# Patient Record
Sex: Male | Born: 1955 | Race: White | Hispanic: Yes | Marital: Married
Health system: Midwestern US, Community
[De-identification: ages and names within clinical notes are randomized; demographics above are authoritative.]

## PROBLEM LIST (undated history)

## (undated) DIAGNOSIS — I10 Essential (primary) hypertension: Secondary | ICD-10-CM

## (undated) DIAGNOSIS — D649 Anemia, unspecified: Secondary | ICD-10-CM

## (undated) DIAGNOSIS — G4733 Obstructive sleep apnea (adult) (pediatric): Secondary | ICD-10-CM

## (undated) DIAGNOSIS — Z0000 Encounter for general adult medical examination without abnormal findings: Secondary | ICD-10-CM

## (undated) DIAGNOSIS — E782 Mixed hyperlipidemia: Secondary | ICD-10-CM

## (undated) DIAGNOSIS — R109 Unspecified abdominal pain: Secondary | ICD-10-CM

## (undated) DIAGNOSIS — R739 Hyperglycemia, unspecified: Secondary | ICD-10-CM

---

## 2017-08-30 ENCOUNTER — Encounter: Attending: Family Medicine

## 2017-09-19 ENCOUNTER — Ambulatory Visit: Admit: 2017-09-19 | Discharge: 2017-09-19 | Payer: PRIVATE HEALTH INSURANCE | Attending: Family Medicine

## 2017-09-19 MED ORDER — ZOSTER VAC RECOMB ADJUVANTED 50 MCG/0.5ML IM SUSR
50 MCG/0.5ML | INTRAMUSCULAR | 1 refills | Status: DC
Start: 2017-09-19 — End: 2018-09-25

## 2017-09-19 MED ORDER — NAPROXEN 500 MG PO TABS
500 MG | ORAL_TABLET | Freq: Two times a day (BID) | ORAL | 0 refills | Status: DC
Start: 2017-09-19 — End: 2018-05-31

## 2017-09-19 NOTE — Progress Notes (Signed)
2 identifiers used: name and dob.  Hands washed before taking vitals.      APPOINTMENT INFO:   New Patient, new to area from [place].  OTHER CONCERNS:  1.) BP seems to be elevated. Self monitoring  2.) Left knee, sharp pain near the knee cap. Bending and applying pressure causes the pain. Onset 1 month.  3.) Achilles heel pain, when first standing after sitting for awhile. Onset: 6 months.  HIGH BP READING  ---------------------------------------------------------------------  Depression screening, GOALS, [facility] and SDOH flowsheets completed.  Health Maintenance:   - Last Colonoscopy: unknown date, 4 yrs   - Last Shingles Vacc: 2-3 yrs   - Last DTaP/Tdap/Td Vacc: over due     Patient Self-Management Goal for Health Maintenance  Weight loss (20 lbs) and eating healthy.  Barriers: The love of food  Plan for overcoming my barriers: better self control  Confidence: 7/10  Anticipated Goal Completion Date: 09/20/2018  -------------------------------------------------------------------    Pt is ok with the medical student being present in the visit.  ----------------------------------------------------------------------  Pt notified after the physician is complete with the visit, the MA will return to go over orders, any questions and check-out.    Room sanitized after visit.

## 2017-09-19 NOTE — Progress Notes (Addendum)
09/19/2017    Jeffrey Cisneros (DOB:  [birth year]) is a 62 y.o. male, here for a preventive medicine evaluation.    Tries to keep to a healthy diet - tries to avoid cheese and salt.   Exercise - hopes to restart next week at the gym. - [employer].   Last colonoscopy- 2014, [facility] in 10 year [facility]  Last Shingrix - not yet. Had zostavax  Tdap - 08/2008.       Complaints today    Left knee pain -x 2 weeks. Only when kneeling. No fall or trauma to the area. No medications taken. No other swelling, no locking, no instability.     Achilles tendon pain x 6 months. Ok when up and walking around. When stationary for a while and then standing up, hurts.   Has been doing stretches, no medications taken.     There is no problem list on file for this patient.      Review of Systems   Constitutional: Negative for chills, fatigue and fever.   HENT: Negative for congestion, ear pain and sore throat.    Eyes: Negative for discharge and redness.   Respiratory: Negative for cough, shortness of breath and wheezing.    Cardiovascular: Negative for chest pain, palpitations and leg swelling.   Gastrointestinal: Negative for abdominal pain, constipation, diarrhea and vomiting.   Genitourinary: Negative for dysuria and frequency.   Musculoskeletal: Negative for arthralgias.   Skin: Negative for rash.   Neurological: Negative for dizziness and headaches.   Hematological: Does not bruise/bleed easily.   Psychiatric/Behavioral: Negative for behavioral problems.       Prior to Visit Medications    Medication Sig Taking? Authorizing Provider   Multiple Vitamin (MULTI-VITAMIN DAILY PO) Take by mouth Yes Historical Provider, MD   Cholecalciferol (VITAMIN D3) 2000 units CAPS Take by mouth daily Yes Historical Provider, MD   cetirizine (ZYRTEC) 10 MG tablet Take 10 mg by mouth daily Yes Historical Provider, MD   zoster recombinant adjuvanted vaccine (SHINGRIX) 50 MCG/0.5ML SUSR injection Inject 0.5 mLs into the muscle See Admin Instructions 1  dose now and repeat in 2-6 months Yes Kanika Bungert Manya SilvasBhatt Abraham, MD   naproxen (NAPROSYN) 500 MG tablet Take 1 tablet by mouth 2 times daily (with meals) for 7 days Yes Caydon Feasel Manya SilvasBhatt Abraham, MD        Allergies   Allergen Reactions   ??? Dilaudid [Hydromorphone Hcl] Anaphylaxis       Past Medical History:   Diagnosis Date   ??? Hypertension    ??? Osteopenia    ??? Prostate cancer [facility](HCC)        Past Surgical History:   Procedure Laterality Date   ??? PROSTATE SURGERY     ??? SKIN BIOPSY         Social History     Socioeconomic History   ??? Marital status: Married     Spouse name: Not on file   ??? Number of children: Not on file   ??? Years of education: Not on file   ??? Highest education level: Not on file   Occupational History   ??? Not on file   Social Needs   ??? Financial resource strain: Not on file   ??? Food insecurity:     Worry: Not on file     Inability: Not on file   ??? Transportation needs:     Medical: Not on file     Non-medical: Not on file   Tobacco  Use   ??? Smoking status: Never Smoker   ??? Smokeless tobacco: Never Used   Substance and Sexual Activity   ??? Alcohol use: Not on file     Comment: social   ??? Drug use: Never   ??? Sexual activity: Yes     Partners: Female   Lifestyle   ??? Physical activity:     Days per week: Not on file     Minutes per session: Not on file   ??? Stress: Not on file   Relationships   ??? Social connections:     Talks on phone: Not on file     Gets together: Not on file     Attends religious service: Not on file     Active member of club or organization: Not on file     Attends meetings of clubs or organizations: Not on file     Relationship status: Not on file   ??? Intimate partner violence:     Fear of current or ex partner: Not on file     Emotionally abused: Not on file     Physically abused: Not on file     Forced sexual activity: Not on file   Other Topics Concern   ??? Not on file   Social History Narrative   ??? Not on file        Family History   Problem Relation Age of Onset   ??? Breast Cancer Mother    ???  Other Mother         Dementia   ??? Other Father         Joaquin Courts   ??? Heart Disease Father        ADVANCE DIRECTIVE: N, Not Received    Vitals:    09/19/17 0933 09/19/17 1114   BP: (!) 144/86 (!) 142/84   Site: Right Upper Arm    Position: Sitting    Cuff Size: Large Adult    Pulse: 70    Temp: 98.4 ??F (36.9 ??C)    TempSrc: Temporal    SpO2: 96%    Weight: 195 lb 14.4 oz (88.9 kg)    Height: 5\' 9"  (1.753 m)        Estimated body mass index is 28.93 kg/m?? as calculated from the following:    Height as of this encounter: 5\' 9"  (1.753 m).    Weight as of this encounter: 195 lb 14.4 oz (88.9 kg).    Physical Exam   Constitutional: He is oriented to person, place, and time. He appears well-developed and well-nourished. No distress.   HENT:   Head: Normocephalic.   Right Ear: External ear normal.   Left Ear: External ear normal.   Nose: Nose normal.   Mouth/Throat: Oropharynx is clear and moist.   Eyes: Pupils are equal, round, and reactive to light. Conjunctivae are normal.   Neck: Normal range of motion. Neck supple. No thyromegaly present.   Cardiovascular: Normal rate, regular rhythm and normal heart sounds. Exam reveals no gallop and no friction rub.   No murmur heard.  Pulmonary/Chest: Effort normal and breath sounds normal. No stridor. No respiratory distress. He has no wheezes. He has no rales.   Abdominal: Soft. Bowel sounds are normal. He exhibits no mass. There is no tenderness. There is no rebound and no guarding.   Musculoskeletal: Normal range of motion. He exhibits no edema.   Left knee - tender with deep palpation over the anterolateral aspect of the knee,  mild swelling. Normal ROM of the knee, no instability felt.   Lower legs - no tenderness over the achilles tendons. Normal ROM at the ankles. No swelling.    Lymphadenopathy:     He has no cervical adenopathy.   Neurological: He is alert and oriented to person, place, and time. No cranial nerve deficit. He exhibits normal muscle tone.   Skin: No rash  noted.   Psychiatric: He has a normal mood and affect. His behavior is normal.   Nursing note and vitals reviewed.      No flowsheet data found.    No results found for: CHOL, CHOLFAST, TRIG, TRIGLYCFAST, HDL, LDLCHOLESTEROL, LDLCALC, GLUF, GLUCOSE, LABA1C    The ASCVD Risk score Denman George DC Jr., et al., 2013) failed to calculate for the following reasons:    Cannot find a previous HDL lab    Cannot find a previous total cholesterol lab    Immunization History   Administered Date(s) Administered   ??? Tdap (Boostrix, Adacel) 09/15/2008   ??? Zoster Live (Zostavax) 09/06/2013       Health Maintenance   Topic Date Due   ??? Hepatitis C screen  September 02, 1955   ??? HIV screen  08/04/1968   ??? Lipid screen  08/04/1993   ??? Diabetes screen  08/04/1993   ??? Colon cancer screen colonoscopy  08/05/2003   ??? Shingles Vaccine (2 of 3) 11/01/2013   ??? Flu vaccine (1) 10/21/2017   ??? DTaP/Tdap/Td vaccine (2 - Td) 09/16/2018   ??? Pneumococcal 0-64 years Vaccine  Aged Out       ASSESSMENT/PLAN:  1. Annual physical exam  Check general blood tests.   Recommended shingrix   Up to date with colonoscopy - records requested.   - Comprehensive Metabolic Panel; Future  - Hemoglobin A1C; Future  - Lipid Panel; Future  - CBC Auto Differential; Future  - TSH without Reflex; Future    2. Need for shingles vaccine  Given script for   - zoster recombinant adjuvanted vaccine [facility]) 50 MCG/0.5ML SUSR injection; Inject 0.5 mLs into the muscle See Admin Instructions 1 dose now and repeat in 2-6 months  Dispense: 0.5 mL; Refill: 1    3. Acute pain of left knee  Possible bursitis  Advised rest, ice.   rx with course of naprosyn  - naproxen (NAPROSYN) 500 MG tablet; Take 1 tablet by mouth 2 times daily (with meals) for 7 days  Dispense: 14 tablet; Refill: 0    4. Achilles tendon pain  Possible tendinitis  rx with course of naprosyn, rest.   Given hand out with exercises   - naproxen (NAPROSYN) 500 MG tablet; Take 1 tablet by mouth 2 times daily (with meals) for 7 days   Dispense: 14 tablet; Refill: 0    5. Elevated blood pressure reading  Discussed diet, exercise, weight loss.   Recheck BP in 4 weeks  Given information on the DASH diet.     6. BMI 28.0-28.9,adult  Elevated. Discussed a healthy diet and exercise.       Return in about 4 weeks (around 10/17/2017) for blood pressure NV check. .    An electronic signature was used to authenticate this note.    --Loreli Slot, MD on 09/20/2017 at 9:00 AM

## 2017-09-19 NOTE — Patient Instructions (Addendum)
Patient Self-Management Goal for Health Maintenance  Weight loss (20 lbs) and eating healthy.  Barriers: The love of food  Plan for overcoming my barriers: better self control  Confidence: 7/10  Anticipated Goal Completion Date: 09/20/2018    Patient Education        DASH Diet: Care Instructions  Your Care Instructions    The DASH diet is an eating plan that can help lower your blood pressure. DASH stands for Dietary Approaches to Stop Hypertension. Hypertension is high blood pressure.  The DASH diet focuses on eating foods that are high in calcium, potassium, and magnesium. These nutrients can lower blood pressure. The foods that are highest in these nutrients are fruits, vegetables, low-fat dairy products, nuts, seeds, and legumes. But taking calcium, potassium, and magnesium supplements instead of eating foods that are high in those nutrients does not have the same effect. The DASH diet also includes whole grains, fish, and poultry.  The DASH diet is one of several lifestyle changes your doctor may recommend to lower your high blood pressure. Your doctor may also want you to decrease the amount of sodium in your diet. Lowering sodium while following the DASH diet can lower blood pressure even further than just the DASH diet alone.  Follow-up care is a key part of your treatment and safety. Be sure to make and go to all appointments, and call your doctor if you are having problems. It's also a good idea to know your test results and keep a list of the medicines you take.  How can you care for yourself at home?  Following the DASH diet  ?? Eat 4 to 5 servings of fruit each day. A serving is 1 medium-sized piece of fruit, ?? cup chopped or canned fruit, 1/4 cup dried fruit, or 4 ounces (?? cup) of fruit juice. Choose fruit more often than fruit juice.  ?? Eat 4 to 5 servings of vegetables each day. A serving is 1 cup of lettuce or raw leafy vegetables, ?? cup of chopped or cooked vegetables, or 4 ounces (?? cup) of  vegetable juice. Choose vegetables more often than vegetable juice.  ?? Get 2 to 3 servings of low-fat and fat-free dairy each day. A serving is 8 ounces of milk, 1 cup of yogurt, or 1 ?? ounces of cheese.  ?? Eat 6 to 8 servings of grains each day. A serving is 1 slice of bread, 1 ounce of dry cereal, or ?? cup of cooked rice, pasta, or cooked cereal. Try to choose whole-grain products as much as possible.  ?? Limit lean meat, poultry, and fish to 2 servings each day. A serving is 3 ounces, about the size of a deck of cards.  ?? Eat 4 to 5 servings of nuts, seeds, and legumes (cooked dried beans, lentils, and split peas) each week. A serving is 1/3 cup of nuts, 2 tablespoons of seeds, or ?? cup of cooked beans or peas.  ?? Limit fats and oils to 2 to 3 servings each day. A serving is 1 teaspoon of vegetable oil or 2 tablespoons of salad dressing.  ?? Limit sweets and added sugars to 5 servings or less a week. A serving is 1 tablespoon jelly or jam, ?? cup sorbet, or 1 cup of lemonade.  ?? Eat less than 2,300 milligrams (mg) of sodium a day. If you limit your sodium to 1,500 mg a day, you can lower your blood pressure even more.  Tips for success  ?? Start  small. Do not try to make dramatic changes to your diet all at once. You might feel that you are missing out on your favorite foods and then be more likely to not follow the plan. Make small changes, and stick with them. Once those changes become habit, add a few more changes.  ?? Try some of the following:  ? Make it a goal to eat a fruit or vegetable at every meal and at snacks. This will make it easy to get the recommended amount of fruits and vegetables each day.  ? Try yogurt topped with fruit and nuts for a snack or healthy dessert.  ? Add lettuce, tomato, cucumber, and onion to sandwiches.  ? Combine a ready-made pizza crust with low-fat mozzarella cheese and lots of vegetable toppings. Try using tomatoes, squash, spinach, broccoli, carrots, cauliflower, and  onions.  ? Have a variety of cut-up vegetables with a low-fat dip as an appetizer instead of chips and dip.  ? Sprinkle sunflower seeds or chopped almonds over salads. Or try adding chopped walnuts or almonds to cooked vegetables.  ? Try some vegetarian meals using beans and peas. Add garbanzo or kidney beans to salads. Make burritos and tacos with mashed pinto beans or black beans.  Where can you learn more?  Go to [email or web address] and sign in to your MyChart account. Enter [phone] [id number] in the Search Health Information box to learn more about "DASH Diet: Care Instructions."     If you do not have an account, please click on the "Sign Up Now" link.  Current as of: September 10, 2016  Content Version: 12.0  ?? 2006-2019 [employer]. Care instructions adapted under license by [facility]. If you have questions about a medical condition or this instruction, always ask your healthcare professional. [employer], [employer] disclaims any warranty or liability for your use of this information.         Patient Education        Achilles Tendon: Exercises  Your Care Instructions  Here are some examples of exercises for your achilles tendon. Start each exercise slowly. Ease off the exercise if you start to have pain.  Your doctor or physical therapist will tell you when you can start these exercises and which ones will work best for you.  Toe stretch  Toe stretch    1. Sit in a chair, and extend your affected leg so that your heel is on the floor.  2. With your hand, reach down and pull your big toe up and back. Pull toward your ankle and away from the floor.  3. Hold the position for at least 15 to 30 seconds.  4. Repeat 2 to 4 times a session, several times a day.    Calf-plantar fascia stretch    1. Sit with your legs extended and knees straight.  2. Place a towel around your foot just under the toes.  3. Hold each end of the towel in each hand, with your hands above your knees.  4. Pull back with  the towel so that your foot stretches toward you.  5. Hold the position for at least 15 to 30 seconds.  6. Repeat 2 to 4 times a session, up to 5 sessions a day.    Floor stretch    1. Stand about 2 feet from a wall, and place your hands on the wall at about shoulder height. Or you can stand behind a chair, placing your hands on the back of it for balance.  2. Step back with the leg you want to stretch. Keep the leg straight, and press your heel into the floor with your toe turned slightly in.  3. Lean forward, and bend your other leg slightly. Feel the stretch in the Achilles tendon of your back leg. Hold for at least 15 to 30 seconds.  4. Repeat 2 to 4 times a session, up to 5 sessions a day.    Stair stretch    1. Stand with the balls of both feet on the edge of a step or curb (or a medium-sized phone book). With at least one hand, hold onto something solid for balance, such as a banister or handrail.  2. Keeping your affected leg straight, slowly let that heel hang down off of the step or curb until you feel a stretch in the back of your calf and/or Achilles area. Some of your weight should still be on the other leg.  3. Hold this position for at least 15 to 30 seconds.  4. Repeat 2 to 4 times a session, up to 5 times a day or whenever your Achilles tendon starts to feel tight. This stretch can also be done with your knee slightly bent.    Strength exercise    1. This exercise will get you started on building strength after an Achilles tendon injury. Your doctor or physical therapist can help you move on to more challenging exercises as you heal and get stronger.  2. Stand on a step with your heel off the edge of the step. Hold on to a handrail or wall for balance.  3. Push up on your toes, then slowly count to 10 as you lower yourself back down until your heel is below the step. If it hurts to push up on your toes, try putting most of your weight on your other foot as you push up, or try using your arms to help  you. If you can't do this exercise without causing pain, stop the exercise and talk to your doctor.  4. Repeat the exercise 8 to 12 times, half with the knee straight and half with the knee bent.    Follow-up care is a key part of your treatment and safety. Be sure to make and go to all appointments, and call your doctor if you are having problems. It's also a good idea to know your test results and keep a list of the medicines you take.  Where can you learn more?  Go to [email or web address] and sign in to your MyChart account. Enter [phone] in the Search Health Information box to learn more about "Achilles Tendon: Exercises."     If you do not have an account, please click on the "Sign Up Now" link.  Current as of: November 09, 2016  Content Version: 12.0  ?? 2006-2019 [employer]. Care instructions adapted under license by [facility]. If you have questions about a medical condition or this instruction, always ask your healthcare professional. [employer] disclaims any warranty or liability for your use of this information.

## 2017-09-24 ENCOUNTER — Encounter

## 2017-09-24 LAB — CBC WITH AUTO DIFFERENTIAL
Basophils %: 0.6 % (ref 0.0–2.0)
Basophils Absolute: 0 10*3/uL (ref 0.0–0.2)
Eosinophils Absolute: 0.1 10*3/uL (ref 0.0–0.5)
Eosinophils: 1.2 % (ref 1.0–6.0)
Granulocytes %: 65.9 % (ref 40.0–80.0)
Hematocrit: 45.1 % (ref 40.0–52.0)
Hemoglobin: 15.1 g/dL (ref 13.0–18.0)
Lymphocyte %: 25.5 % (ref 20.0–40.0)
Lymphocytes Absolute: 1.9 10*3/uL (ref 1.0–4.3)
MCH: 30.9 pg (ref 26.0–34.0)
MCHC: 33.5 % (ref 32.0–36.0)
MCV: 92.1 fL (ref 80.0–98.0)
MPV: 10.6 fL — ABNORMAL HIGH (ref 7.4–10.4)
Monocytes %: 6.8 % (ref 2.0–10.0)
Monocytes Absolute: 0.5 10*3/uL (ref 0.0–0.8)
Neutrophils Absolute: 4.9 10*3/uL (ref 1.8–7.0)
Platelets: 156 10*3/uL (ref 140–440)
RBC: 4.89 10*6/uL (ref 4.40–5.90)
RDW: 13.4 % (ref 11.5–14.5)
WBC: 7.4 10*3/uL (ref 3.6–10.7)

## 2017-09-24 LAB — HEMOGLOBIN A1C
Estimated Avg Glucose: 108 mg/dL
Hemoglobin A1C: 5.4 % (ref 4.0–5.7)

## 2017-09-24 LAB — COMPREHENSIVE METABOLIC PANEL
ALT: 16 U/L (ref 13–69)
AST: 20 U/L (ref 15–46)
Albumin,Serum: 4.1 g/dL (ref 3.5–5.0)
Alkaline Phosphatase: 77 U/L (ref 38–126)
Anion Gap: 8 NA
BUN: 22 mg/dL — ABNORMAL HIGH (ref 7–20)
CO2: 27 mmol/L (ref 22–30)
Calcium: 9.1 mg/dL (ref 8.4–10.4)
Chloride: 105 mmol/L (ref 98–107)
Creatinine: 1.03 mg/dL (ref 0.52–1.25)
Glucose: 91 mg/dL (ref 70–100)
Potassium: 4.4 mmol/L (ref 3.5–5.1)
Sodium: 140 mmol/L (ref 135–145)
Total Bilirubin: 0.8 mg/dL (ref 0.2–1.3)
Total Protein: 7.4 g/dL (ref 6.3–8.2)
eGFR AA: >60 mL/min (ref >60)
eGFR NON-AA: >60 mL/min (ref >60)

## 2017-09-24 LAB — LIPID PANEL
Chol/HDL Ratio: 5 NA
Cholesterol: 191 mg/dL (ref <200)
HDL: 35 mg/dL — ABNORMAL LOW (ref 40–60)
LDL Cholesterol: 130 mg/dL — AB (ref <100)
Triglycerides: 130 mg/dL (ref <150)

## 2017-09-24 LAB — TSH: TSH: 1.571 u[IU]/mL (ref 0.465–4.680)

## 2017-10-17 ENCOUNTER — Telehealth

## 2017-10-17 ENCOUNTER — Encounter: Admit: 2017-10-17 | Discharge: 2017-10-17 | Payer: PRIVATE HEALTH INSURANCE

## 2017-10-17 DIAGNOSIS — Z0130 Encounter for examination of blood pressure without abnormal findings: Secondary | ICD-10-CM

## 2017-10-17 MED ORDER — HYDROCHLOROTHIAZIDE 25 MG PO TABS
25 MG | ORAL_TABLET | Freq: Every morning | ORAL | 1 refills | Status: DC
Start: 2017-10-17 — End: 2018-04-01

## 2017-10-17 NOTE — Telephone Encounter (Signed)
BP medication sent in. It is the first line medication treatment that we prescribe for hypertension.   Please give a NV appt in 1 week to recheck BP on the new medication. Please advise to call if any concerning symptoms arise after starting the medication - like rash, dizziness, weakness, headache, chest pains  Thanks.

## 2017-10-17 NOTE — Progress Notes (Signed)
Noted. See TE

## 2017-10-17 NOTE — Telephone Encounter (Signed)
Please tell patient that his blood pressures today were elevated. Has he had any other recent BP checks that were lower?  Since these recent BPs have been elevated at our office, the usual recommendation would be to start a blood pressure lowering medication. Is he open to doing that at this point or does he want to keep working on the lifestyle changes and recheck again in a month?

## 2017-10-17 NOTE — Telephone Encounter (Signed)
Pt aware  No other BP taken  Willing to start BP med and will work on lifestyle changes as well  Would like sent to local pharm listed for 90 days.

## 2017-10-17 NOTE — Telephone Encounter (Signed)
Pt notified, NV scheduled

## 2017-10-17 NOTE — Progress Notes (Signed)
Identified Pt by name and DOB  Washed hands after entering room with Pt       The patient, Jeffrey Cisneros, identity was verified by name. Supervising provider for clinic visit: Darin EngelsAbraham.     Chief Complaint   Patient presents with   ??? Blood Pressure Check       Reason for BP visit: BP elevated in the office    There were no vitals taken for this visit.  BP Readings from Last 3 Encounters:   09/19/17 (!) 142/84       Pulse Readings from Last 3 Encounters:   09/19/17 70       Patient denies any shortness of breath or distress at this time.   Patient states compliant with medications as written: Yes  Medication Reconciliation completed.  BP medication taken prior to this visit? yes  BP taken with manual  Home monitoring BP:   Phone number where patient can be reached:  Pt advised if follow up needed, outreach will occur within 48 hrs.    Future Appointments   Date Time Provider Department Center   10/17/2017  9:00 AM SCHEDULE, [facility]   09/25/2018  9:20 AM Payal Manya SilvasBhatt Abraham, MD [facility]

## 2017-10-24 ENCOUNTER — Encounter: Admit: 2017-10-24 | Discharge: 2017-10-24 | Payer: PRIVATE HEALTH INSURANCE

## 2017-10-24 DIAGNOSIS — Z0130 Encounter for examination of blood pressure without abnormal findings: Secondary | ICD-10-CM

## 2017-10-24 NOTE — Progress Notes (Signed)
Please let him know that his BP looks better, continue on same medication, low sodium diet and exercise.  please give him a follow up appt with me in 6 months to follow up HTN. Thanks.

## 2017-10-24 NOTE — Progress Notes (Signed)
The patient, Jeffrey Cisneros, identity was verified by name and DOB. Supervising provider for clinic visit: Darin Engels.       Reason for BP visit: New medication    There were no vitals taken for this visit.  BP Readings from Last 3 Encounters:   10/17/17 (!) 146/92   09/19/17 (!) 142/84       Pulse Readings from Last 3 Encounters:   09/19/17 70       Patient denies any shortness of breath or distress at this time.   Patient states compliant with medications as written: Yes  Medication Reconciliation completed.  BP medication taken prior to this visit? yes  BP taken with ( manual) 144/84 rt arm large cuff,  132/80 rt arm large cuff.  Home monitoring BP: yes  Phone number where patient can be reached: [phone]    Pt advised if follow up needed, outreach will occur within 48 hrs.    Future Appointments   Date Time Provider Department Center   09/25/2018  9:20 AM Payal Manya Silvas, MD [facility]

## 2018-04-01 MED ORDER — HYDROCHLOROTHIAZIDE 25 MG PO TABS
25 MG | ORAL_TABLET | ORAL | 3 refills | Status: DC
Start: 2018-04-01 — End: 2018-05-27

## 2018-05-24 NOTE — Telephone Encounter (Signed)
S: Patient called the clinical access center with complaint of LOC   B:Just prior to call  A:Patient states he felt warm, felt faint, went to hands and knees,then he doesn't remember anything. Wife states he was lying prone on floor, unable to arouse, had pulse and respiratory effort seemed shallow. Was calling 911 when patient woke up and said "no". Denies hitting head. Incontinent of urine during episode, but no shaking or tremors noted.  Wife states when he first woke was "here but wasn't here" per wife, not A&O but she states he still does not seem quite right. BP 128/81, HR 79, no rhythm abnormalities detected on wife's heart monitor. Reports seasonal allergy symptoms. Donated blood last week, Hgb normal is16.4 - 16.8, on 3/26, was 13.9. Has not noted blood in urine or stool. Reports he still feels weak while lying down and ears are ringing. No history of seizures.  R:Care advice provided. Patient instructed to call back with worsening symptoms, concerns or questions. He will go to ED as per disposition, uncertain where they will go or if they will call EMS or wife will drive.    Reason for Disposition  ??? Fainted > 15 minutes ago and still feels weak or dizzy    Protocols used: FAINTING-ADULT-[place]

## 2018-05-27 MED ORDER — AMLODIPINE BESYLATE 5 MG PO TABS
5 MG | ORAL_TABLET | Freq: Every day | ORAL | 1 refills | Status: DC
Start: 2018-05-27 — End: 2018-05-31

## 2018-05-27 NOTE — Telephone Encounter (Signed)
Lm to cb.

## 2018-05-27 NOTE — Telephone Encounter (Signed)
Name of Caller: Margie Billet phone number: verified on file    Relationship to Patient: patient    Provider: Darin Engels    Practice:  [facility]    Chief Complaint/Reason for Call: Gave pt message went to ER as triage nurse suggested. They kept him at [facility] from [facility] and they did bloodwork EEG EKG chest xray CT scan and determined dehyrdration and took him off of BP hydrochlorathiazide as it is also a diaretic and prescribed him amlodopine 5mg  30 day and would like a refill of this sent to [id number] - [place], [place] - [address] - P [phone] - F [phone] for 90 day if the Dr feels he can take this. He is feeling better and will c/b if needing anything.     Best time of day caller can be reached:        Patient advised that office/PCP has 24-48 business hours to return their call:

## 2018-05-27 NOTE — Telephone Encounter (Signed)
Sent to pharmacy. Thanks.

## 2018-05-27 NOTE — Telephone Encounter (Signed)
Noted. Please call patient to follow up to see how he is doing   Thanks.

## 2018-05-31 ENCOUNTER — Telehealth
Admit: 2018-05-31 | Discharge: 2018-05-31 | Payer: PRIVATE HEALTH INSURANCE | Attending: Family Medicine | Primary: Family Medicine

## 2018-05-31 DIAGNOSIS — I10 Essential (primary) hypertension: Secondary | ICD-10-CM

## 2018-05-31 MED ORDER — AMLODIPINE BESYLATE 10 MG PO TABS
10 MG | ORAL_TABLET | Freq: Every day | ORAL | 1 refills | Status: DC
Start: 2018-05-31 — End: 2018-11-18

## 2018-05-31 NOTE — Progress Notes (Signed)
[facility]  [facility]  [address]  [place] [id number]  Dept: [phone]  Dept Fax: [phone]  Loc: [phone]   Subjective   Jeffrey SimonMichael Cisneros is a [age] y.o. who presents for a telehealth visit.  Chief complaint:   Chief Complaint   Patient presents with   ??? Hypertension     See Nurse Triage. Recent syncope episode, hospital stay, testing and medication changes. Home readings 150/100 average with the new medications.         HPI     Per Hospital Notes, on 05/24/2018 patient had a syncopal event. He went to the ER, where work up was done including CT brain, EKG, troponin which showed no concerning findings. Found to have mild renal insufficiency, with creatinine 1.08.  syncopal event was thought to be due to dehydration. HCTZ was discontinued and started on amlodipine 5 mg    Since being on new medication, patient has noted consistently elevated BP readings - about 150/100.     Through Care Everywhere - hb 05/24/2018 was 12.7 , then 12.0 on 05/25/2018    Review of Systems   Constitutional: Negative for fatigue.   Respiratory: Negative for chest tightness and shortness of breath.    Cardiovascular: Positive for palpitations (occasional). Negative for chest pain and leg swelling.   Neurological: Positive for dizziness (occasional, less frequent). Negative for headaches.       Allergies   Allergen Reactions   ??? Dilaudid [Hydromorphone Hcl] Anaphylaxis       [x]  PMH, allergies, and social history reviewed and updated as appropriate   [x]  Medication list reviewed/updated  [x]  Allergies reviewed/updated  [x]  Problem list reviewed/updated    []  Patient requests medication refills, see below for orders.    Exam - through video call - patient well appearing, in no apparent distress.   Assessment/Plan   1. Essential hypertension  - uncontrolled per home BP readings.   Increase amlodipine to 10 mg   Advised to call if any concerning side effects.    Recheck renal function.   amLODIPine (NORVASC) 10 MG tablet; Take 1 tablet by mouth daily  Dispense: 90 tablet; Refill: 1  - Basic Metabolic Panel; Future    2. History of syncope  -with negative CT brain and negative cardiac work up in hospital.   - no further recurrences.   - advised to go to the ER if having concerning symptoms.    3. Anemia, unspecified type  Patient reports donating blood some days before the CBC was done.   Recheck hemoglobin, check iron levels, vitamin b12 and folate.   - CBC Auto Differential; Future  - Iron and TIBC; Future  - Vitamin B12; Future  - Folate; Future     Patient was seen today via Telehealth by agreement and consent in light of the current COVID-19 pandemic. I used the following Telehealth technology: Doxy.me with audio and video capabilities. This patient encounter is appropriate and reasonable under the circumstances given the patient's particular presentation at this time. The patient has been advised of the potential risks and limitations of this mode of treatment (including but not limited to the absence of in-person examination) and has agreed to be treated in a remote fashion in spite of them. Any and all of the patient's/patient's family's questions on this issue have been answered and I have made no promises or guarantees to the patient. The patient has also been advised to  contact this office for worsening conditions or problems, and seek emergency medical treatment and/or call 911 if the patient deems either necessary.    Loreli Slot, MD  05/31/18  10:29 AM EDT

## 2018-05-31 NOTE — Telephone Encounter (Signed)
S: pt calling [facility] c/o blood pressure   B: ongoing for a week   A: pt was in the hospital for dehydration on 05/24/18, He was started on amlodipine on 05/27/18 and was taken off HCTZ. He states his blood pressure yesterday was 150/100 and this am it was 159/104. Pt denies chest pain or SOB. He denies headaches or vision problems.   R:Virtual visit for today with Dr. Darin Engels at 10:40am.  [phone]. Insurance verified, care advise given and advised pt to call back with any worsening symptoms. Pt verbalized understanding.   Note:  Pt states sometimes his phone automatically goes straight to voice mail so if this happens can the doctor text him.   Reason for Disposition  ??? Systolic BP >= 130 OR Diastolic >= 80, and is taking BP medications    Protocols used: HIGH BLOOD PRESSURE-ADULT-[place]

## 2018-05-31 NOTE — Telephone Encounter (Signed)
Noted. Thanks.

## 2018-06-05 ENCOUNTER — Encounter

## 2018-06-05 LAB — FOLATE: Folate: >20 ng/mL (ref 2.8–20.0)

## 2018-06-05 LAB — CBC WITH AUTO DIFFERENTIAL
Absolute Baso #: 0.1 10*3/uL (ref 0.0–0.2)
Absolute Eos #: 0.1 10*3/uL (ref 0.0–0.5)
Absolute Lymph #: 2.4 10*3/uL (ref 1.0–4.3)
Absolute Mono #: 0.6 10*3/uL (ref 0.0–0.8)
Absolute Neut #: 5.9 10*3/uL (ref 1.8–7.0)
Basophils: 0.6 % (ref 0.0–2.0)
Eosinophils: 1.1 % (ref 1.0–6.0)
Granulocytes %: 65.1 % (ref 40.0–80.0)
Hematocrit: 42.2 % (ref 40.0–52.0)
Hemoglobin: 14.3 g/dL (ref 13.0–18.0)
Lymphocyte %: 26.1 % (ref 20.0–40.0)
MCH: 31.9 pg (ref 26.0–34.0)
MCHC: 33.9 % (ref 32.0–36.0)
MCV: 94 fL (ref 80.0–98.0)
MPV: 10.3 fL (ref 7.4–10.4)
Monocytes: 7.1 % (ref 2.0–10.0)
Platelets: 197 10*3/uL (ref 140–440)
RBC: 4.49 10*6/uL (ref 4.40–5.90)
RDW: 14 % (ref 11.5–14.5)
WBC: 9.1 10*3/uL (ref 3.6–10.7)

## 2018-06-05 LAB — BASIC METABOLIC PANEL
Anion Gap: 13 NA
BUN: 15 mg/dL (ref 7–20)
CO2: 26 mmol/L (ref 22–30)
Calcium: 9.4 mg/dL (ref 8.4–10.4)
Chloride: 102 mmol/L (ref 98–107)
Creatinine: 1.13 mg/dL (ref 0.52–1.25)
EGFR IF NonAfrican American: >60 mL/min (ref >60)
Glucose: 94 mg/dL (ref 70–100)
Potassium: 4.3 mmol/L (ref 3.5–5.1)
Sodium: 141 mmol/L (ref 135–145)
eGFR African American: >60 mL/min (ref >60)

## 2018-06-05 LAB — FERRITIN: Ferritin: 20 ng/mL (ref 18–464)

## 2018-06-05 LAB — VITAMIN B12: Vitamin B-12: 572 pg/mL (ref 239–931)

## 2018-06-05 LAB — IRON AND TIBC
Iron: 82 ug/dL (ref 49–181)
Sat: 23 % (ref 15–50)
TIBC: 352 ug/dL (ref 261–497)

## 2018-06-07 NOTE — Telephone Encounter (Signed)
From: Jeffrey Cisneros  To: Jeffrey Slot, MD  Sent: 06/07/2018 12:31 PM EDT  Subject: Test Results Question    I have a question about FERRITIN resulted on 06/05/18, 2:59 PM.    Thanks Dr Darin Engels. Was my creatinine level checked? I remember the Dr at the hospital stating that it was not in the normal range.    Also, you asked me to track my bp since we went to the new non diuretic bp medicine and then upping the dosage on 4/12. Here are my results:    4/10 156/98  4/11 163/102  4/12 158/88  4/12 148/90  4/16 151/97  4/16 129/84  4/17 142/87   4/17 147/86    What are your thoughts?    Thanks,  Jeffrey Cisneros

## 2018-06-17 NOTE — Telephone Encounter (Signed)
From: [name]  To: Loreli Slot, MD  Sent: 06/17/2018 3:12 PM EDT  Subject: Visit Follow-Up Question    Hi Dr. Darin Engels,    You asked me to track my BP over the last week given my change in dosage. Here you go.    Blood Pressure  144/90 4/22  116/82 4/24  113/77 4/24  127/76 4/27    Thanks for your interest,  [name]

## 2018-08-27 NOTE — Telephone Encounter (Signed)
Insurance updated

## 2018-09-25 ENCOUNTER — Ambulatory Visit: Admit: 2018-09-25 | Discharge: 2018-09-25 | Payer: MEDICARE | Attending: Family Medicine | Primary: Family Medicine

## 2018-09-25 ENCOUNTER — Encounter

## 2018-09-25 DIAGNOSIS — Z0000 Encounter for general adult medical examination without abnormal findings: Secondary | ICD-10-CM

## 2018-09-25 LAB — LIPID PANEL
Chol/HDL Ratio: 5 NA
Cholesterol: 195 mg/dL (ref <200)
HDL: 43 mg/dL (ref 40–60)
LDL Cholesterol: 132 mg/dL — AB (ref <100)
Triglycerides: 102 mg/dL (ref <150)

## 2018-09-25 LAB — COMPREHENSIVE METABOLIC PANEL
ALT: 15 U/L (ref 0–49)
AST: 20 U/L (ref 15–46)
Albumin,Serum: 4.6 g/dL (ref 3.5–5.0)
Alkaline Phosphatase: 86 U/L (ref 38–126)
Anion Gap: 9 NA
BUN: 17 mg/dL (ref 7–20)
CO2: 24 mmol/L (ref 22–30)
Calcium: 9.2 mg/dL (ref 8.4–10.4)
Chloride: 106 mmol/L (ref 98–107)
Creatinine: 1.08 mg/dL (ref 0.52–1.25)
EGFR IF NonAfrican American: 71.6 mL/min (ref >60)
Glucose: 101 mg/dL — ABNORMAL HIGH (ref 70–100)
Potassium: 4.8 mmol/L (ref 3.5–5.1)
Sodium: 139 mmol/L (ref 135–145)
Total Bilirubin: 0.5 mg/dL (ref 0.2–1.3)
Total Protein: 7.8 g/dL (ref 6.3–8.2)
eGFR African American: 83 mL/min (ref >60)

## 2018-09-25 LAB — HEPATITIS C ANTIBODY: Hepatitis C Ab: NOT DETECTED NA

## 2018-09-25 NOTE — Progress Notes (Signed)
Medicare Annual Wellness Visit  Name: Jeffrey Cisneros Today???s Date: 09/25/2018   MRN: [id number] Sex: Male   Age: 63 y.o. Ethnicity: Non-Hispanic/Non Latino   DOB: 04/02/1955 Race: [place]      Jeffrey Cisneros is here for Annual Exam    Able to perform all ADLs and IADLs  No concerns for hearing or vision.       HYPERTENSION - fluctuating blood pressure control without side effects  Compliant with medication, low sodium diet, exercise.  No chest pain, no SOB, no edema, no headache, no dizziness  Lab Results   Component Value Date    NA 139 09/25/2018    K 4.8 09/25/2018    CL 106 09/25/2018    CO2 24 09/25/2018    BUN 17 09/25/2018    CREATININE 1.08 09/25/2018    GLUCOSE 101 09/25/2018    CALCIUM 9.2 09/25/2018        OSA - diagnosed years ago. Has not been using a C pap machine. Does snore during the night.   Has been having fluctuating blood pressure control.     Right upper quadrant flutter/disomfort- intermittent, unclear trigger. Has had xray of chest to evaluate which was normal.     Screenings for behavioral, psychosocial and functional/safety risks, and cognitive dysfunction are all negative except as indicated below. These results, as well as other patient data from the Health Risk Assessment form, are documented in Flowsheets linked to this Encounter.    Allergies   Allergen Reactions   ??? Dilaudid [Hydromorphone Hcl] Anaphylaxis   ??? Seasonal      Other reaction(s): Other: See Comments  runny nose, sneezing         Prior to Visit Medications    Medication Sig Taking? Authorizing Provider   Probiotic Product (PROBIOTIC + TURMERIC EXTRACT PO) Take 1,000 mg by mouth daily Yes Historical Provider, MD   amLODIPine (NORVASC) 10 MG tablet Take 1 tablet by mouth daily Yes Jeffrey Jeffrey Manya SilvasBhatt Abraham, MD   Multiple Vitamin (MULTI-VITAMIN DAILY PO) Take by mouth Yes Historical Provider, MD   Cholecalciferol (VITAMIN D3) 2000 units CAPS Take by mouth daily Yes Historical Provider, MD   cetirizine (ZYRTEC) 10 MG tablet Take 10 mg  by mouth daily Yes Historical Provider, MD         Past Medical History:   Diagnosis Date   ??? Hypertension    ??? Osteopenia    ??? Prostate cancer [facility](HCC)        Past Surgical History:   Procedure Laterality Date   ??? PROSTATE SURGERY     ??? SKIN BIOPSY           Family History   Problem Relation Age of Onset   ??? Breast Cancer Mother    ??? Other Mother         Dementia   ??? Other Father         Jeffrey Cisneros   ??? Heart Disease Father        CareTeam (Including outside providers/suppliers regularly involved in providing care):   Patient Care Team:  Jeffrey SlotPayal Bhatt Abraham, MD as PCP - General (Family Medicine)    Wt Readings from Last 3 Encounters:   09/25/18 192 lb 12.8 oz (87.5 kg)   09/19/17 195 lb 14.4 oz (88.9 kg)     Vitals:    09/25/18 0925   BP: 124/70   Site: Right Upper Arm   Position: Sitting   Cuff Size: Medium Adult   Pulse: 74  Temp: 98 ??F (36.7 ??C)   TempSrc: Temporal   SpO2: 97%   Weight: 192 lb 12.8 oz (87.5 kg)   Height: 5' 9.02" (1.753 m)     Body mass index is 28.46 kg/m??.    Based upon direct observation of the patient, evaluation of cognition reveals recent and remote memory intact.    General Appearance: alert and oriented to person, place and time, well developed and well- nourished, in no acute distress  Skin: warm and dry, no rash or erythema  Head: normocephalic and atraumatic  Eyes: pupils equal, round, and reactive to light, extraocular eye movements intact, conjunctivae normal  ENT: tympanic membrane, external ear and ear canal normal bilaterally, nose without deformity, nasal mucosa and turbinates normal without polyps  Neck: supple and non-tender without mass, no thyromegaly or thyroid nodules, no cervical lymphadenopathy  Pulmonary/Chest: clear to auscultation bilaterally- no wheezes, rales or rhonchi, normal air movement, no respiratory distress  Cardiovascular: normal rate, regular rhythm, normal S1 and S2, no murmurs, rubs, clicks, or gallops, distal pulses intact, no carotid bruits  Abdomen:  soft, non-tender, non-distended, no masses or organomegaly  Extremities: no cyanosis, clubbing or edema  Musculoskeletal: normal range of motion, no joint swelling, deformity or tenderness  Neurologic:  no cranial nerve deficit, gait, coordination and speech normal    Patient's complete Health Risk Assessment and screening values have been reviewed and are found in Flowsheets. The following problems were reviewed today and where indicated follow up appointments were made and/or referrals ordered.    Positive Risk Factor Screenings with Interventions:     General Health:  General  In general, how would you say your health is?: Good  In the past 7 days, have you experienced any of the following? New or Increased Pain, New or Increased Fatigue, Loneliness, Social Isolation, Stress or Anger?: (!) New or Increased Fatigue  Do you get the social and emotional support that you need?: Yes  Do you have a Living Will?: (!) No  General Health Risk Interventions:  ?? No Living Will: will provide info at next visit    Safety:  Safety  Do you have working smoke detectors?: Yes  Have all throw rugs been removed or fastened?: (!) No  Do you have non-slip mats or surfaces in all bathtubs/showers?: (!) No  Do all of your stairways have a railing or banister?: Yes  Are your doorways, halls and stairs free of clutter?: Yes  Do you always fasten your seatbelt when you are in a car?: Yes  Safety Interventions:  ?? will provide info on future visit    Personalized Preventive Plan   Current Health Maintenance Status  Immunization History   Administered Date(s) Administered   ??? Pneumococcal Conjugate 13-valent (Prevnar13) 09/25/2018   ??? Tdap (Boostrix, Adacel) 09/15/2008   ??? Zoster Live (Zostavax) 09/06/2013   ??? Zoster Recombinant (Shingrix) 09/26/2017, 12/07/2017        Health Maintenance   Topic Date Due   ??? Hepatitis C screen  Jan 23, 1956   ??? HIV screen  08/04/1968   ??? Colon cancer screen colonoscopy  08/05/2003   ??? PSA counseling   08/05/2003   ??? DTaP/Tdap/Td vaccine (2 - Td) 09/16/2018   ??? Annual Wellness Visit (AWV)  09/25/2018   ??? Flu vaccine (1) 10/22/2018   ??? Potassium monitoring  06/05/2019   ??? Creatinine monitoring  06/05/2019   ??? Pneumococcal 65+ years Vaccine (2 of 2 - PPSV23) 09/25/2019   ??? Diabetes screen  09/24/2020   ???  Lipid screen  09/25/2022   ??? Shingles Vaccine  Completed   ??? Hepatitis A vaccine  Aged Out   ??? Hepatitis B vaccine  Aged Out   ??? Hib vaccine  Aged Out   ??? Meningococcal (ACWY) vaccine  Aged Out     Recommendations for Preventive Services Due: see orders and patient instructions/AVS.  .  Recommended screening schedule for the next 5-10 years is provided to the patient in written form: see Patient Instructions/AVS.      1. Routine general medical examination at a health care facility  See above.     2. Need for vaccination against Streptococcus pneumoniae  Given   - PREVNAR 13 IM (Pneumococcal conjugate vaccine 13-valent)    3. Mixed hyperlipidemia  -recheck lipids to assess control   - Lipid Panel; Future    4. Encounter for HCV screening test for low risk patient  Check   - Hepatitis C Antibody; Future    5. Abdominal discomfort in right upper quadrant  Check [place] to evaluate further   - US Abdomen Complete; Future    6. Essential hypertension  Check   - Comprehensive Metabolic Panel; Future    7. OSA (obstructive sleep apnea)  Check baseline sleep study to assess.   - [facility]

## 2018-09-25 NOTE — Progress Notes (Signed)
2 identifiers used: name and dob.  Hands washed before taking vitals.      APPOINTMENT INFO:   Annual Exam/Physical.  OTHER CONCERNS:  1.) Syncope episode, all is good now. [facility]  2.) BP is fluctuating.   ---------------------------------------------------------------------  Depression screening, and GOALS flowsheets completed.  Health Maintenance:   - Last Colonoscopy : unknown, will look for dates   - Last Pneum Vacc : never   - Last DTaP/Tdap/Td Vacc : more than 10 yrs.    - Last Blood Work (lipid, HIV, etc.) : never    Patient Self-Management Goal for Health Maintenance  Weight loss (20 lbs) and eating healthy.  Barriers: The love of food  Plan for overcoming my barriers: better self control  Confidence: 7/10  Anticipated Goal Completion Date: 09/25/2019  ----------------------------------------------------------------------  Pt notified after the provider is complete with the visit, the MA will return to go over orders, any questions and check-out.    Room sanitized after visit.

## 2018-09-25 NOTE — Patient Instructions (Addendum)
Patient Education        Well Visit, Over 65: Care Instructions  Your Care Instructions     Physical exams can help you stay healthy. Your doctor has checked your overall health and may have suggested ways to take good care of yourself. He or she also may have recommended tests. At home, you can help prevent illness with healthy eating, regular exercise, and other steps.  Follow-up care is a key part of your treatment and safety. Be sure to make and go to all appointments, and call your doctor if you are having problems. It's also a good idea to know your test results and keep a list of the medicines you take.  How can you care for yourself at home?  ?? Reach and stay at a healthy weight. This will lower your risk for many problems, such as obesity, diabetes, heart disease, and high blood pressure.  ?? Get at least 30 minutes of exercise on most days of the week. Walking is a good choice. You also may want to do other activities, such as running, swimming, cycling, or playing tennis or team sports.  ?? Do not smoke. Smoking can make health problems worse. If you need help quitting, talk to your doctor about stop-smoking programs and medicines. These can increase your chances of quitting for good.  ?? Protect your skin from too much sun. When you're outdoors from 10 a.m. to 4 p.m., stay in the shade or cover up with clothing and a hat with a wide brim. Wear sunglasses that block UV rays. Even when it's cloudy, put broad-spectrum sunscreen (SPF 30 or higher) on any exposed skin.  ?? See a dentist one or two times a year for checkups and to have your teeth cleaned.  ?? Wear a seat belt in the car.  Follow your doctor's advice about when to have certain tests. These tests can spot problems early.  For men and women  ?? Cholesterol. Your doctor will tell you how often to have this done based on your overall health and other things that can increase your risk for heart attack and stroke.  ?? Blood pressure. Have your blood  pressure checked during a routine doctor visit. Your doctor will tell you how often to check your blood pressure based on your age, your blood pressure results, and other factors.  ?? Diabetes. Ask your doctor whether you should have tests for diabetes.  ?? Vision. Experts recommend that you have yearly exams for glaucoma and other age-related eye problems.  ?? Hearing. Tell your doctor if you notice any change in your hearing. You can have tests to find out how well you hear.  ?? Colon cancer tests. Keep having colon cancer tests as your doctor recommends. You can have one of several types of tests.  ?? Heart attack and stroke risk. At least every 4 to 6 years, you should have your risk for heart attack and stroke assessed. Your doctor uses factors such as your age, blood pressure, cholesterol, and whether you smoke or have diabetes to show what your risk for a heart attack or stroke is over the next 10 years.  ?? Osteoporosis. Talk to your doctor about whether you should have a bone density test to find out whether you have thinning bones. Ask your doctor if you need to take a calcium plus vitamin D supplement. You may be able to get enough calcium and vitamin D through your diet.  For women  ?? Pap   test and pelvic exam. You may no longer need a Pap test. Talk with your doctor about whether to stop or continue to have Pap tests.  ?? Breast exam and mammogram. Ask how often you should have a mammogram, which is an X-ray of your breasts. A mammogram can spot breast cancer before it can be felt and when it is easiest to treat.  ?? Thyroid disease. Talk to your doctor about whether to have your thyroid checked as part of a regular physical exam. Women have an increased chance of a thyroid problem.  For men  ?? Prostate exam. Talk to your doctor about whether you should have a blood test (called a PSA test) for prostate cancer. Experts recommend that you discuss the benefits and risks of the test with your doctor before you  decide whether to have this test. Some experts say that men ages 40 and older no longer need testing.  ?? Abdominal aortic aneurysm. Ask your doctor whether you should have a test to check for an aneurysm. You may need a test if you ever smoked or if your parent, brother, sister, or child has had an aneurysm.  When should you call for help?  Watch closely for changes in your health, and be sure to contact your doctor if you have any problems or symptoms that concern you.  Where can you learn more?  Go to [email or web address] and sign in to your MyChart account. Enter [id number] in the [place] box to learn more about "Well Visit, Over 65: Care Instructions."     If you do not have an account, please click on the "Sign Up Now" link.  Current as of: October 11, 2017??????????????????????????????Content Version: 12.5  ?? 2006-2020 Healthwise, Incorporated.   Care instructions adapted under license by [facility]. If you have questions about a medical condition or this instruction, always ask your healthcare professional. [place] any warranty or liability for your use of this information.         Personalized Preventive Plan for Jeffrey Cisneros - 09/25/2018  Medicare offers a range of preventive health benefits. Some of the tests and screenings are paid in full while other may be subject to a deductible, co-insurance, and/or copay.    Some of these benefits include a comprehensive review of your medical history including lifestyle, illnesses that may run in your family, and various assessments and screenings as appropriate.    After reviewing your medical record and screening and assessments performed today your provider may have ordered immunizations, labs, imaging, and/or referrals for you.  A list of these orders (if applicable) as well as your Preventive Care list are included within your After Visit Summary for your review.    Other Preventive Recommendations:    ?? A preventive  eye exam performed by an eye specialist is recommended every 1-2 years to screen for glaucoma; cataracts, macular degeneration, and other eye disorders.  ?? A preventive dental visit is recommended every 6 months.  ?? Try to get at least 150 minutes of exercise per week or 10,000 steps per day on a pedometer .  ?? Order or download the FREE "Exercise & Physical Activity: Your Everyday Guide" from [employer]. Call 858-199-7985 or search [employer] online.  ?? You need 1200-1500 mg of calcium and 1000-2000 IU of vitamin D per day. It is possible to meet your calcium requirement with diet alone, but a vitamin D supplement is usually necessary  to meet this goal.  ?? When exposed to the sun, use a sunscreen that protects against both UVA and UVB radiation with an SPF of 30 or greater. Reapply every 2 to 3 hours or after sweating, drying off with a towel, or swimming.  ?? Always wear a seat belt when traveling in a car. Always wear a helmet when riding a bicycle or motorcycle.

## 2018-09-27 MED ORDER — ASPIRIN EC 81 MG PO TBEC
81 MG | ORAL_TABLET | Freq: Every day | ORAL | 3 refills | Status: DC
Start: 2018-09-27 — End: 2020-03-30

## 2018-09-27 MED ORDER — ATORVASTATIN CALCIUM 10 MG PO TABS
10 MG | ORAL_TABLET | Freq: Every day | ORAL | 3 refills | Status: DC
Start: 2018-09-27 — End: 2019-09-17

## 2018-09-27 NOTE — Telephone Encounter (Signed)
Please advise

## 2018-09-27 NOTE — Telephone Encounter (Signed)
From: [name]  Sent: 09/26/2018 2:06 PM EDT  To: [facility] Clinical Staff  Subject: RE: Visit Follow-Up Question    Hi Marli Diego,    Thanks for forwarding question. I have not heard back yet.    Kathlene November    ----- Message -----  From: Lyn Hollingshead  Sent: 09/25/18, 2:48 PM  To: [name]  Subject: RE: Visit Follow-Up Question    Your message has been routed to Dr. Darin Engels. Awaiting her response.  Bobbi.      ----- Message -----   From:Zephan Shanks   Sent:09/25/2018 12:00 PM EDT   GH:WWYWE Manya Silvas, MD   Subject:Visit Follow-Up Question    Hi Dr Darin Engels,    I forgot to ask to today, the week prior to my syncope incident I had donated blood. I am scheduled to give blood again next week. Do you recommend I proceed or not?    Thank you,  Kathlene November

## 2018-10-03 ENCOUNTER — Telehealth

## 2018-10-03 NOTE — Telephone Encounter (Signed)
Sleep study 'pending'--info in media mgr-P2P 1 [phone] #4 case #[id number]--GSPJSU review and advise?

## 2018-10-04 NOTE — Telephone Encounter (Signed)
Peer to Peer done    Home sleep study will be approved without need for authorization.     Will change to that test.     Please cancel in-lab study.    Thanks.

## 2018-10-04 NOTE — Telephone Encounter (Addendum)
I sent a TE to dr Darin Engels yesterday saying his Ins is requesting a p2p-all the info is in the TE    FYI---his wife's Ins does not require an auth for the test but I was waiting to send both orders together to [facility] after his was auth'd

## 2018-10-04 NOTE — Telephone Encounter (Signed)
From: [name]  To: Loreli Slot, MD  Sent: 10/04/2018 10:19 AM EDT  Subject: Visit Follow-Up Question    Good morning Dr Darin Engels,    I just spoke with the [facility]. They said that the order from you needs to be written as a home sleep study if our insurances will pay for it. Could you write the orders with those directions and fax them to your sleep center.    Thank you,  Kathlene November

## 2018-10-04 NOTE — Telephone Encounter (Signed)
Spoke to insurance  Peer to Peer scheduled at 3:45 pm.

## 2018-10-04 NOTE — Telephone Encounter (Signed)
From: [name]  To: Loreli Slot, MD  Sent: 10/04/2018 8:32 AM EDT  Subject: Visit Follow-Up Question    Good morning Dr. Tiajuana Amass and I hav not heard from the [facility]

## 2018-10-08 ENCOUNTER — Inpatient Hospital Stay: Admit: 2018-10-08 | Attending: Family Medicine | Primary: Family Medicine

## 2018-10-08 ENCOUNTER — Encounter

## 2018-10-08 DIAGNOSIS — R1011 Right upper quadrant pain: Secondary | ICD-10-CM

## 2018-10-08 NOTE — Telephone Encounter (Signed)
From: [name]  To: Loreli Slot, MD  Sent: 10/08/2018 2:30 PM EDT  Subject: Test Results Question    Hi Dr Darin Engels,    Given my ultrasound result should a urinalysis be scheduled?    Thanks,  Kathlene November

## 2018-10-08 NOTE — Telephone Encounter (Signed)
Pt notified

## 2018-10-08 NOTE — Telephone Encounter (Signed)
-----   Message from Loreli Slot, MD sent at 10/08/2018  2:25 PM EDT -----  Normal gall bladder.   Liver with increased fat - no medication needed but lowering fats in the diet and exercise are recommended.   Cysts on both kidneys - the radiologist did not mention any concerning feature with these but if you are still having pains or discomfort in the abdomen or any blood in the urine,  I would recommend seeing a nephrologist to evaluate these further.

## 2018-10-08 NOTE — Other (Unsigned)
Patient Acct Nbr: [id number]   Primary AUTH/CERT:   Primary Insurance Company Name: Monia Pouch  Primary Insurance Plan name: Orpah Clinton  Primary Insurance Group Number: [id number]  Primary Insurance Plan Type: Health  Primary Insurance Policy Number: [id number]

## 2018-10-14 ENCOUNTER — Encounter

## 2018-10-14 LAB — URINALYSIS
Bilirubin Urine: NEGATIVE mg/dL
Glucose, Ur: NORMAL mg/dL (ref <70)
Ketones, Urine: NEGATIVE mg/dL
LEUKOCYTES, UA: NEGATIVE Leu/uL
Nitrite, Urine: NEGATIVE NA
Occult Blood,Urine: 0.06 mg/dL — AB
Specific Gravity, Urine: 1.022 NA (ref 1.005–1.030)
Total Protein, Urine: NEGATIVE mg/dL
Urobilinogen, Urine: NORMAL mg/dL (ref 0–1)
pH, Urine: 6 NA (ref 5.0–8.0)

## 2018-10-15 NOTE — Telephone Encounter (Signed)
Pt viewed results. MyChart message sent to patient requesting contact information of his urologist's office for results.

## 2018-10-15 NOTE — Telephone Encounter (Signed)
From: [name]  To: Loreli Slot, MD  Sent: 10/15/2018 2:07 PM EDT  Subject: Test Results Question    [phone]   [id number]  [id number]      ----- Message -----   From:MA Deziray Nabi K   Sent:10/15/2018 2:00 PM EDT   [name]   Subject:RE: Test Results Question    Jeffrey Cisneros.    I see that you reviewed the results of the UA, also that you already have a urologist. I will send the results, but do you have a phone # or exact location so I can research the phone and fax #.  Thanks  [employer].      ----- Message -----   From:Jeffrey Cisneros   Sent:10/08/2018 2:30 PM EDT   TX:LGYVW Manya Silvas, MD   Subject:Test Results Question    Hi Dr Darin Engels,    Given my ultrasound result should a urinalysis be scheduled?    Thanks,  Jeffrey Cisneros

## 2018-10-15 NOTE — Telephone Encounter (Signed)
Fax # [phone]  [employer].     Results right faxed

## 2018-11-01 NOTE — Other (Unsigned)
Patient Acct Nbr: [id number]   Primary AUTH/CERT:   Primary Insurance Company Name: [employer] Insurance Plan name: Orpah Clinton  Primary Insurance Group Number: [id number]  Primary Insurance Plan Type: Health  Primary Insurance Policy Number: [id number]

## 2018-11-07 ENCOUNTER — Telehealth

## 2018-11-07 NOTE — Telephone Encounter (Signed)
Please let patient know that his sleep study confirms sleep apnea.   A c pap titration test is recommended. This is to determine the pressures on the machine that will correct the sleep apnea  The sleep specialist recommended an in-lab titration study.   I put a referral in for that but if the insurance does not over this, will prescribe an auto c pap that automatically adjusts. Both these options will be coordinated by the sleep center so he should listen out for calls from them.   Thanks.     FYI Barb, c pap titration test ordered.     Thanks.

## 2018-11-07 NOTE — Telephone Encounter (Signed)
Pt aware.

## 2018-11-08 NOTE — Telephone Encounter (Signed)
Hi Barb,   Do you or the sleep center check if the insurance will cover the in lab c pap titration or the auto titration?  Thanks.

## 2018-11-08 NOTE — Telephone Encounter (Signed)
From: [name]  To: Loreli Slot, MD  Sent: 11/08/2018 11:36 AM EDT  Subject: Visit Follow-Up Question    Hi Dr.    Regarding the sleep study, rather than doing that, would it be possible to get the auto adjusting cpap. I ask because as we loose weight the machine will adjust. Will Barb's and my Insurance pay for that?    Thank you,  Kathlene November

## 2018-11-11 NOTE — Telephone Encounter (Signed)
I dont know how much their Ins will cover or not-I sent him a msg about that-as long as the order is correct the sleep lab should be able to help him

## 2018-11-13 ENCOUNTER — Telehealth

## 2018-11-13 MED ORDER — RESPIRATORY THERAPY SUPPLIES DEVI
0 refills | Status: DC
Start: 2018-11-13 — End: 2020-03-30

## 2018-11-13 NOTE — Telephone Encounter (Signed)
Script for Auto C Pap done.   Please find out from patient which medical supplier he would like to use? [facility]?

## 2018-11-13 NOTE — Telephone Encounter (Signed)
Name of Caller: Jeffrey Cisneros phone number: [phone]    Relationship to Patient: patient    Provider: Darin Engels    Practice:  [facility]    Chief Complaint/Reason for Call:  Pt states he was advised by the sleep lab to come in and do an overnight sleep study in the facility to find the settings for a sleep pap machine. Pt states he is not comfortable during that with the current pandemic. He was told by the sleep lab to ask Dr.Abraham if she would be able to prescribe an adjustable CPAP machine, he states he was told to tell Dr.Abraham to call the sleep lab for any additional information or any questions she has.     Pt states his wife is also requesting an adjustable CPAP as well. Please Advise.     Best time of day caller can be reached: AM       Patient advised that office/PCP has 24-48 business hours to return their call: Yes

## 2018-11-13 NOTE — Telephone Encounter (Signed)
Per patient [facility] is okay, insurance also covers its.

## 2018-11-14 NOTE — Telephone Encounter (Addendum)
Jeffrey Cisneros spoke with [facility] titration has not been done. Will fax results to [place] when finished. Will send all info to [facility].pwe dr. Darin Engels will have APAP no need for titration will fax to [facility].faxed to [phone]

## 2018-11-14 NOTE — Telephone Encounter (Signed)
Lm to cb.

## 2018-11-14 NOTE — Telephone Encounter (Signed)
Will send my chart mesg.

## 2018-11-14 NOTE — Telephone Encounter (Signed)
Message released to patient as written.     Patient's further questions if applicable:     Were all questions from office addressed or relayed to the patient from encounter Yes

## 2018-11-14 NOTE — Telephone Encounter (Signed)
From: [name]  To: Loreli Slot, MD  Sent: 11/13/2018 12:36 PM EDT  Subject: Non-Urgent Medical Question    Do we need to schedule an appointment to get flu shots or just arrive? Do you have the 65 and over vaccine?

## 2018-11-18 ENCOUNTER — Encounter

## 2018-11-18 MED ORDER — AMLODIPINE BESYLATE 10 MG PO TABS
10 MG | ORAL_TABLET | ORAL | 1 refills | Status: DC
Start: 2018-11-18 — End: 2019-05-12

## 2018-11-20 NOTE — Telephone Encounter (Signed)
Name of Caller: Lynelle Doctor phone number: [phone]  ??  Relationship to Patient:[facility]   ??  Provider:Dr. Darin Engels  ??  Practice:  [facility]  ??  Chief Complaint/Reason for Call: Bradly Chris called from [facility] in regard to Pap Therapy Order for supplies not being signed. Bradly Chris states the machine order was signed. Please advise.   ??  Best time of day caller can be reached: Any  ??  ??  Patient advised that office/PCP has 24-48 business hours to return their call: No  ??

## 2018-11-22 NOTE — Telephone Encounter (Signed)
Jeffrey Cisneros is calling again to see if the supplies form has been signed? The first one faxed was not signed. Please advise.

## 2018-11-25 NOTE — Telephone Encounter (Signed)
There was a form that I had completed last week.  I do not see it scanned to the chart.   Can they re-fax us the form if you cannot find it.   Thanks.

## 2018-11-25 NOTE — Telephone Encounter (Signed)
Can order for supplies be placed for the Pt   Order will need to faxed to [facility] after completed  PCP to place order

## 2018-11-26 NOTE — Telephone Encounter (Signed)
I filled out a form for both he and his wife last week  Not scanned into media  It may be on Kathy's desk.  Please fax and let the patient know when faxed  Thanks.

## 2018-11-26 NOTE — Telephone Encounter (Signed)
From: [name]  To: Loreli Slot, MD  Sent: 11/26/2018 1:33 PM EDT  Subject: Prescription Question    Good afternoon,    It's my understanding that as of a short while ago [facility] was still waiting on getting a prescription signed for me and my wife Barb. Please let me know the status and if there is anything we need to do.    Thank you,  Jeffrey Cisneros

## 2018-11-27 NOTE — Telephone Encounter (Signed)
Please call patient when completed.

## 2018-11-28 ENCOUNTER — Encounter: Admit: 2018-11-28 | Discharge: 2018-11-28 | Payer: MEDICARE | Primary: Family Medicine

## 2018-11-28 DIAGNOSIS — Z23 Encounter for immunization: Secondary | ICD-10-CM

## 2018-11-28 NOTE — Progress Notes (Signed)
The patient, Jeffrey Cisneros, identity was verified by name and dob  written order VIS (s) given to patient for review prior to immunization administration.  Received informed consent to proceed with influenza immunization (s). Immunizations given as ordered. Tolerated procedure well.Supervising MD Francia Greaves Darin Engels.

## 2018-11-28 NOTE — Telephone Encounter (Signed)
Form completed and right faxed.

## 2018-11-28 NOTE — Telephone Encounter (Signed)
PAP therapy order right faxed. Patient notified.

## 2019-02-28 NOTE — Telephone Encounter (Signed)
Name of Caller: Elon Eoff phone number: [phone]    Relationship to Patient: patient    Provider: N/A    Practice:  [facility]    Chief Complaint/Reason for Call: Pt is asking for a call back to reschedule an appointment, verified no appointment is scheduled and he states should have been scheduled for 03/04/19. Please contact pt to clarify.    Best time of day caller can be reached: Any       Patient advised that office/PCP has 24-48 business hours to return their call: No

## 2019-03-04 NOTE — Telephone Encounter (Signed)
Pt scheduled

## 2019-03-24 NOTE — Telephone Encounter (Signed)
Patient was called and notified and the appointment was changed to a virtual visit.

## 2019-03-24 NOTE — Telephone Encounter (Signed)
It was a routine HTN follow up.   Can be changed to a VV if patient prefers.   Please advise him on how to access the appt through MyChart if he prefers at VV  Thanks.

## 2019-03-24 NOTE — Telephone Encounter (Signed)
From: [name]  To: Loreli Slot, MD  Sent: 03/24/2019 1:08 PM EST  Subject: Visit Follow-Up Question    Hi Dr Darin Engels,    I have a follow up appointment for this Friday. I don't remember the purpose. Is this appointment better in person or is Teledoc ok?    I look forward to my appointment.    Jeffrey Cisneros

## 2019-03-28 ENCOUNTER — Telehealth: Admit: 2019-03-28 | Discharge: 2019-03-28 | Payer: MEDICARE | Attending: Family Medicine | Primary: Family Medicine

## 2019-03-28 DIAGNOSIS — I10 Essential (primary) hypertension: Secondary | ICD-10-CM

## 2019-03-28 MED ORDER — LOSARTAN POTASSIUM 25 MG PO TABS
25 MG | ORAL_TABLET | Freq: Every day | ORAL | 0 refills | Status: DC
Start: 2019-03-28 — End: 2019-04-14

## 2019-03-28 NOTE — Progress Notes (Signed)
[facility]  [facility]  [address]  [place] [address]  Dept: [phone]  Dept Fax: [phone]  Loc: [phone]   Subjective   Jeffrey Cisneros is a 64 y.o. who presents for a telehealth visit. Chief complaint:   Chief Complaint   Patient presents with   ??? Hyperlipidemia   ??? Hypertension        HPI     Hypertension   -uncontrolled,  has been compliant with medication. Trying to keep to a healthy diet. Did gain some weight over the holidays. Has not been exercising as much as before but hopes to improve this.   Recent home BP's on different days have been :    135/93  150/96  145/95  181/103  144/88    No chest pain, no SOB, no edema, no headache or dizziness.     HYPERLIPIDEMIA - complaint with medication with no concerning side effect     Renal cyst - had work up through [facility] including cystoscopy to evaluate microscopic hematuria which showed no concerning findings. Patient does have a family history of kidney cancer.     Review of Systems   Respiratory: Negative for chest tightness and shortness of breath.    Cardiovascular: Negative for chest pain, palpitations and leg swelling.   Musculoskeletal: Negative for myalgias.   Neurological: Negative for dizziness and headaches.      Allergies   Allergen Reactions   ??? Dilaudid [Hydromorphone Hcl] Anaphylaxis   ??? Seasonal      Other reaction(s): Other: See Comments  runny nose, sneezing       [x]  PMH, allergies, and social history reviewed and updated as appropriate   [x]  Medication list reviewed/updated  [x]  Allergies reviewed/updated  [x]  Problem list reviewed/updated    []  Patient requests medication refills, see below for orders.    Objective (if obtainable)   Physical Exam  Vitals signs reviewed.   Neurological:      General: No focal deficit present.      Mental Status: Mental status is at baseline.   Psychiatric:         Mood and Affect: Mood normal.         Behavior:  Behavior normal.          Assessment/Plan   1. Essential hypertension  - uncontrolled on home BP monitoring.   - add losartan 25 mg daily, continue amlodipine 10 mg daily.   - patient asked to report home BP readings after a week.   - refer to nephrology to evaluate  = External Referral to Nephrology  - losartan (COZAAR) 25 MG tablet; Take 1 tablet by mouth daily  Dispense: 30 tablet; Refill: 0    2. Mixed hyperlipidemia   - continue on statin.   - patient will continue to work on his diet and exercise.     3. Renal cysts.   Bilateral, larger on the left.   - recent cystoscopy to evaluate microscopic hematuria was normal. (done through [facility])  - refer to nephrology to evaluate further.        Patient was seen today via Telehealth by agreement and consent in light of the current COVID-19 pandemic. I used the following Telehealth technology: Audio and video capabilities. This patient encounter is appropriate and reasonable under the circumstances given the patient's particular presentation at this time. The patient has been advised of the potential risks and limitations of this  mode of treatment (including but not limited to the absence of in-person examination) and has agreed to be treated in a remote fashion in spite of them. Any and all of the patient's/patient's family's questions on this issue have been answered and I have made no promises or guarantees to the patient. The patient has also been advised to contact this office for worsening conditions or problems, and seek emergency medical treatment and/or call 911 if the patient deems either necessary. The patient stated that they are currently in the state of [place]. If the patient is a minor, permission has been obtained by the parent or guardian for the patient to receive medical care at this visit.   Loreli Slot, MD  03/28/19  9:05 AM EST

## 2019-03-28 NOTE — Patient Instructions (Signed)
Patient Education        losartan  Pronunciation:  loe SAR tan  Brand:  Cozaar  What is the most important information I should know about losartan?  Do not use if you are pregnant, and tell your doctor right away if you become pregnant. Losartan can cause injury or death to the unborn baby during your second or third trimester.  If you have diabetes, do not use losartan together with any medication that contains aliskiren (a blood pressure medicine).  What is losartan?  Losartan is an angiotensin II receptor antagonist (sometimes called an ARB blocker).  Losartan is used to treat high blood pressure (hypertension) in adults and children who are at least 6 years old. It is also used to lower the risk of stroke in certain people with heart disease.  Losartan is also used to slow long-term kidney damage in people with type 2 diabetes who also have high blood pressure.  Losartan may also be used for purposes not listed in this medication guide.  What should I discuss with my healthcare provider before taking losartan?  You should not use losartan if you are allergic to it.  If you have diabetes, do not use losartan together with any medication that contains aliskiren (a blood pressure medicine).  You may also need to avoid taking losartan with aliskiren if you have kidney disease.  Do not use if you are pregnant, and tell your doctor right away if you become pregnant. Losartan can cause injury or death to the unborn baby if you take the medicine during your second or third trimester.  Tell your doctor if you have ever had:  ?? kidney disease;  ?? liver disease;  ?? congestive heart failure;  ?? an electrolyte imbalance (such as high levels of potassium in your blood);  ?? if you are on a low-salt diet; or  ?? if you are dehydrated.  You should not breast-feed while using this medicine.  Losartan is not approved for use by anyone younger than 66 years old.  How should I take losartan?  Follow all directions on your  prescription label and read all medication guides or instruction sheets. Your doctor may occasionally change your dose. Use the medicine exactly as directed.  You may take losartan with or without food.  Call your doctor if you are sick with vomiting or diarrhea, or if you are sweating more than usual. You can easily become dehydrated while taking losartan. This can lead to very low blood pressure, a serious electrolyte imbalance, or kidney failure.  Your blood pressure will need to be checked often and you may need other blood and urine tests.  It may take 3 to 6 weeks before your blood pressure is under control. For best results, keep using the medicine as directed. Talk with your doctor if your condition does not improve after 3 weeks of treatment.  If you have high blood pressure, keep using this medicine even if you feel well. High blood pressure often has no symptoms. You may need to use blood pressure medicine for the rest of your life.  Store at room temperature away from moisture, heat, and light.  What happens if I miss a dose?  Take the medicine as soon as you can, but skip the missed dose if it is almost time for your next dose. Do not take two doses at one time.  What happens if I overdose?  Seek emergency medical attention or call the Poison Help line   at 1-800-222-1222.  What should I avoid while taking losartan?  Drinking alcohol can further lower your blood pressure and may increase certain side effects of losartan.  Do not use potassium supplements or salt substitutes, unless your doctor has told you to.  Avoid getting up too fast from a sitting or lying position, or you may feel dizzy.  What are the possible side effects of losartan?  Get emergency medical help if you have signs of an allergic reaction: hives; difficult breathing; swelling of your face, lips, tongue, or throat.  Call your doctor at once if you have:  ?? a light-headed feeling, like you might pass out;  ?? pain or burning when you  urinate;  ?? high potassium level --nausea, weakness, tingly feeling, chest pain, irregular heartbeats, loss of movement; or  ?? kidney problems --little or no urination, rapid weight gain, painful or difficult urination, swelling in your hands, feet, or ankles.  Common side effects may include:  ?? dizziness;  ?? back pain; or  ?? cold symptoms such as stuffy nose, sneezing, sore throat.  This is not a complete list of side effects and others may occur. Call your doctor for medical advice about side effects. You may report side effects to FDA at 1-800-FDA-1088.  What other drugs will affect losartan?  Tell your doctor about all your other medicines, especially:  ?? a diuretic or "water pill";  ?? other blood pressure medications;  ?? lithium; or  ?? NSAIDs (nonsteroidal anti-inflammatory drugs) --aspirin, ibuprofen (Advil, Motrin), naproxen (Aleve), celecoxib, diclofenac, indomethacin, meloxicam, and others.  This list is not complete. Other drugs may affect losartan, including prescription and over-the-counter medicines, vitamins, and herbal products. Not all possible drug interactions are listed here.  Where can I get more information?  Your pharmacist can provide more information about losartan.  Remember, keep this and all other medicines out of the reach of children, never share your medicines with others, and use this medication only for the indication prescribed.   Every effort has been made to ensure that the information provided by [employer] ('[employer]') is accurate, up-to-date, and complete, but no guarantee is made to that effect. Drug information contained herein may be time sensitive. [employer] information has been compiled for use by healthcare practitioners and consumers in the [place] and therefore [employer] does not warrant that uses outside of the [place] are appropriate, unless specifically indicated otherwise. [employer]'s drug information does not endorse drugs, diagnose patients or recommend  therapy. [employer]'s drug information is an informational resource designed to assist licensed healthcare practitioners in caring for their patients and/or to serve consumers viewing this service as a supplement to, and not a substitute for, the expertise, skill, knowledge and judgment of healthcare practitioners. The absence of a warning for a given drug or drug combination in no way should be construed to indicate that the drug or drug combination is safe, effective or appropriate for any given patient. [employer] does not assume any responsibility for any aspect of healthcare administered with the aid of information [employer] provides. The information contained herein is not intended to cover all possible uses, directions, precautions, warnings, drug interactions, allergic reactions, or adverse effects. If you have questions about the drugs you are taking, check with your doctor, nurse or pharmacist.  Copyright 1996-2020 [employer] Version: 16.01. Revision date: 05/30/2017.  Care instructions adapted under license by [facility]. If you have questions about a medical condition or this instruction, always ask your healthcare   professional. [employer] disclaims any warranty or liability for your use of this information.

## 2019-04-04 NOTE — Telephone Encounter (Signed)
From: Jeffrey Cisneros  To: Jeffrey Slot, MD  Sent: 04/04/2019 9:44 AM EST  Subject: Visit Follow-Up Question    Fr Darin Engels,    Per your request, attached are my bp readings for the past week. Please advise.    Thank you,  Jeffrey Cisneros

## 2019-04-10 ENCOUNTER — Ambulatory Visit: Admit: 2019-04-10 | Payer: MEDICARE | Primary: Family Medicine

## 2019-04-11 NOTE — Telephone Encounter (Signed)
From: [name]  To: Loreli Slot, MD  Sent: 04/11/2019 11:33 AM EST  Subject: Visit Follow-Up Question    Hi Dr Darin Engels. Attached are this weeks bp readings after doubling dosage. Also, I have not heard from the nephrologist's office you were referring me to. Please advise on both.    Jeffrey Cisneros

## 2019-04-14 MED ORDER — LOSARTAN POTASSIUM 50 MG PO TABS
50 MG | ORAL_TABLET | Freq: Every day | ORAL | 3 refills | Status: AC
Start: 2019-04-14 — End: ?

## 2019-04-14 NOTE — Telephone Encounter (Signed)
From: [name]  To: Loreli Slot, MD  Sent: 04/11/2019 4:55 PM EST  Subject: Visit Follow-Up Question    Hi Dr Darin Engels, I am just confirming you received my communication earlier today with two attachments showing my blood pressure.   If no please let me know and I will resend.    Thanks and have a nice weekend!    Kathlene November

## 2019-04-29 ENCOUNTER — Encounter: Attending: Physician Assistant | Primary: Family Medicine

## 2019-05-08 ENCOUNTER — Ambulatory Visit: Admit: 2019-05-08 | Payer: MEDICARE | Primary: Family Medicine

## 2019-05-12 ENCOUNTER — Encounter

## 2019-05-12 MED ORDER — AMLODIPINE BESYLATE 10 MG PO TABS
10 MG | ORAL_TABLET | ORAL | 1 refills | Status: DC
Start: 2019-05-12 — End: 2020-08-20

## 2019-09-17 MED ORDER — ATORVASTATIN CALCIUM 10 MG PO TABS
10 MG | ORAL_TABLET | ORAL | 3 refills | Status: DC
Start: 2019-09-17 — End: 2020-08-20

## 2019-09-17 NOTE — Telephone Encounter (Signed)
Last office visit: 03/28/2019   Next office visit: 09/29/2019

## 2019-09-29 ENCOUNTER — Ambulatory Visit: Admit: 2019-09-29 | Discharge: 2019-09-29 | Payer: MEDICARE | Attending: Family Medicine | Primary: Family Medicine

## 2019-09-29 DIAGNOSIS — Z0000 Encounter for general adult medical examination without abnormal findings: Secondary | ICD-10-CM

## 2019-09-29 NOTE — Progress Notes (Addendum)
Medicare Annual Wellness Visit  Name: Jeffrey Cisneros Today???s Date: 10/03/2019   MRN: [id number] Sex: Male   Age: 64 y.o. Ethnicity: Hispanic / Latino   DOB: [birth year] Race: White (non-Hispanic)      Shubham Thackston is here for Medicare AWV    Able to perform all ADLs and IADLs.    Tries to keep to a healthy diet - tries to avoid cheese and salt.   Exercise - keeps physically active. In process of moving.     Last colonoscopy- 2014, repeat in 10 year - [facility]  Last Shingrix - 09/2017, 11/2017  Tdap - 08/2008.   Last COVID 19 vaccine - 04/10/19, 05/08/19  Due for pneumovax      HYPERTENSION - controlled on current medication without side effects  Compliant with medications  No chest pain, no SOB, no edema, no headache, no dizziness  Had recent renal function tests through nephrologist in April/May 2021- result is not available.     HYPERLIPIDEMIA - stable on current medication, compliant with medication    History of prostate cancer - s/p prostatectomy. Follows at least yearly with Urologist for serial PSA's and monitoring.     OSA- has not been able to achieve good compliance with machine due to his sleep positions     Screenings for behavioral, psychosocial and functional/safety risks, and cognitive dysfunction are all negative except as indicated below. These results, as well as other patient data from the Health Risk Assessment form, are documented in Flowsheets linked to this Encounter.    Allergies   Allergen Reactions   ??? Dilaudid [Hydromorphone Hcl] Anaphylaxis   ??? Seasonal      Other reaction(s): Other: See Comments  runny nose, sneezing         Prior to Visit Medications    Medication Sig Taking? Authorizing Provider   chlorthalidone (HYGROTON) 25 MG tablet Take 25 mg by mouth three times a week Yes Historical Provider, MD   atorvastatin (LIPITOR) 10 MG tablet TAKE 1 TABLET BY MOUTH EVERY DAY Yes Sanaiyah Kirchhoff Manya Silvas, MD   amLODIPine (NORVASC) 10 MG tablet TAKE 1 TABLET BY MOUTH EVERY DAY Yes Shereena Berquist Manya Silvas, MD   losartan (COZAAR) 50 MG tablet Take 1 tablet by mouth daily Yes Makaela Cando Manya Silvas, MD   Respiratory Therapy Supplies DEVI AUTO CPAP with heated humidification with pressure between 4-20 mmHg with mask of patient choice. Yes Takeira Yanes Manya Silvas, MD   aspirin EC 81 MG EC tablet Take 1 tablet by mouth daily Yes Elizabeth Haff Manya Silvas, MD   Probiotic Product (PROBIOTIC + TURMERIC EXTRACT PO) Take 1,000 mg by mouth daily Yes Historical Provider, MD   Multiple Vitamin (MULTI-VITAMIN DAILY PO) Take by mouth Yes Historical Provider, MD   Cholecalciferol (VITAMIN D3) 2000 units CAPS Take by mouth daily Yes Historical Provider, MD   cetirizine (ZYRTEC) 10 MG tablet Take 10 mg by mouth daily Yes Historical Provider, MD         Past Medical History:   Diagnosis Date   ??? Hypertension    ??? Osteopenia    ??? Prostate cancer [facility])        Past Surgical History:   Procedure Laterality Date   ??? PROSTATE SURGERY     ??? SKIN BIOPSY           Family History   Problem Relation Age of Onset   ??? Breast Cancer Mother    ??? Other Mother  Dementia   ??? Other Father         Joaquin Courts   ??? Heart Disease Father        CareTeam (Including outside providers/suppliers regularly involved in providing care):   Patient Care Team:  Kataleena Holsapple Manya Silvas, MD as PCP - General (Family Medicine)    Wt Readings from Last 3 Encounters:   09/29/19 201 lb 12.8 oz (91.5 kg)   09/25/18 192 lb 12.8 oz (87.5 kg)   09/19/17 195 lb 14.4 oz (88.9 kg)     Vitals:    09/29/19 0912   BP: 110/80   Pulse: 79   SpO2: 97%   Weight: 201 lb 12.8 oz (91.5 kg)   Height: 5\' 4"  (1.626 m)     Body mass index is 34.64 kg/m??.    Based upon direct observation of the patient, evaluation of cognition reveals recent and remote memory intact.    General Appearance: alert and oriented to person, place and time, well developed and well- nourished, in no acute distress  Skin: warm and dry, no rash or erythema  Head: normocephalic and atraumatic  Eyes: pupils equal, round, and  reactive to light, extraocular eye movements intact, conjunctivae normal  ENT: tympanic membrane, external ear and ear canal normal bilaterally, nose without deformity, nasal mucosa and turbinates normal without polyps  Neck: supple and non-tender without mass, no thyromegaly or thyroid nodules, no cervical lymphadenopathy  Pulmonary/Chest: clear to auscultation bilaterally- no wheezes, rales or rhonchi, normal air movement, no respiratory distress  Cardiovascular: normal rate, regular rhythm, normal S1 and S2, no murmurs, rubs, clicks, or gallops, distal pulses intact, no carotid bruits  Abdomen: soft, non-tender, non-distended, no masses or organomegaly  Extremities: no cyanosis, clubbing or edema  Musculoskeletal: normal range of motion, no joint swelling, deformity or tenderness  Neurologic: no cranial nerve deficit, gait, coordination and speech normal    Patient's complete Health Risk Assessment and screening values have been reviewed and are found in Flowsheets. The following problems were reviewed today and where indicated follow up appointments were made and/or referrals ordered.    Positive Risk Factor Screenings with Interventions:            General Health and ACP:  General  In general, how would you say your health is?: Very Good  In the past 7 days, have you experienced any of the following? New or Increased Pain, New or Increased Fatigue, Loneliness, Social Isolation, Stress or Anger?: None of These  Do you get the social and emotional support that you need?: Yes  Do you have a Living Will?: (!) No  Advance Directives     Power of Attorney Living Will ACP-Advance Directive ACP-Power of Attorney    Not on File Not on File Not on File Not on File      General Health Risk Interventions:  ?? No Living Will: given handout with information    Health Habits/Nutrition:  Health Habits/Nutrition  Do you exercise for at least 20 minutes 2-3 times per week?: (!) No  Have you lost any weight without trying in the  past 3 months?: No  Do you eat only one meal per day?: No  Have you seen the dentist within the past year?: Yes  Body mass index: (!) 34.64  Health Habits/Nutrition Interventions:  ?? Inadequate physical activity:  educational materials provided to promote increased physical activity    Hearing/Vision:   Hearing Screening    125Hz  250Hz  500Hz  1000Hz  2000Hz  3000Hz  4000Hz   6000Hz  8000Hz    Right ear:            Left ear:               Visual Acuity Screening    Right eye Left eye Both eyes   Without correction:      With correction: 20/13 20/70 20/20      Hearing/Vision  Do you or your family notice any trouble with your hearing that hasn't been managed with hearing aids?: No  Do you have difficulty driving, watching TV, or doing any of your daily activities because of your eyesight?: No  Have you had an eye exam within the past year?: Yes  Hearing/Vision Interventions:  ?? Vision concerns:  paitent follows with opthalmologist    Safety:  Safety  Do you have working smoke detectors?: Yes  Have all throw rugs been removed or fastened?: (!) No  Do you have non-slip mats or surfaces in all bathtubs/showers?: Yes  Do all of your stairways have a railing or banister?: Yes  Are your doorways, halls and stairs free of clutter?: (!) No  Do you always fasten your seatbelt when you are in a car?: Yes  Safety Interventions:  ?? Home safety tips provided     Personalized Preventive Plan   Current Health Maintenance Status  Immunization History   Administered Date(s) Administered   ??? COVID-19, Moderna, PF, 179mcg/0.5mL 04/10/2019, 05/08/2019   ??? Influenza, High-dose, Quadv, 65 yrs +, IM (Fluzone) 11/28/2018   ??? Pneumococcal Conjugate 13-valent (Prevnar13) 09/25/2018   ??? Pneumococcal Polysaccharide (Pneumovax23) 09/29/2019   ??? Tdap (Boostrix, Adacel) 09/15/2008   ??? Zoster Live (Zostavax) 09/06/2013   ??? Zoster Recombinant (Shingrix) 09/26/2017, 12/07/2017        Health Maintenance   Topic Date Due   ??? Colon cancer screen colonoscopy   Never done   ??? PSA counseling  Never done   ??? DTaP/Tdap/Td vaccine (2 - Td or Tdap) 09/16/2018   ??? Flu vaccine (1) 10/22/2019   ??? Diabetes screen  09/24/2020   ??? Lipid screen  09/29/2020   ??? Annual Wellness Visit (AWV)  09/29/2020   ??? Potassium monitoring  09/29/2020   ??? Creatinine monitoring  09/29/2020   ??? Shingles Vaccine  Completed   ??? Pneumococcal 65+ years Vaccine  Completed   ??? COVID-19 Vaccine  Completed   ??? Hepatitis C screen  Completed   ??? Hepatitis A vaccine  Aged Out   ??? Hepatitis B vaccine  Aged Out   ??? Hib vaccine  Aged Out   ??? Meningococcal (ACWY) vaccine  Aged Out     Recommendations for Preventive Services Due: see orders and patient instructions/AVS.  .  Recommended screening schedule for the next 5-10 years is provided to the patient in written form: see Patient Instructions/AVS.       1. Routine general medical examination at a health care facility  See plan above.   Discussed recommendation for Tdap, Pneumovax 23  Up to date with colon cancer screening  PSAs are ordered by Urology.     2. Essential hypertension  Blood pressure controlled  Continue present medication, low sodium diet, exercise.   Recheck renal function and electrolytes.   Follow up in 6 months.   - CBC Auto Differential; Future  - Comprehensive Metabolic Panel; Future    3. Mixed hyperlipidemia  Recheck lipids to assess control  Continue statin, diet and exercise.   - Lipid Panel; Future    4. Personal history of  prostate cancer  Follows with Urology    5. OSA (obstructive sleep apnea)  - possibly uncontrolled, not able to keep c pap mask on well due to sleep positions.   Discussed referral to a sleep medicine specialist.   Patient will call when ready to pursue this referral     6. Need for Streptococcus pneumoniae vaccination  - PNEUMOVAX 23 subcutaneous/IM (Pneumococcal polysaccharide vaccine 23-valent >= 2yo)

## 2019-09-29 NOTE — Patient Instructions (Signed)
Personalized Preventive Plan for Jeffrey Cisneros - 09/29/2019  Medicare offers a range of preventive health benefits. Some of the tests and screenings are paid in full while other may be subject to a deductible, co-insurance, and/or copay.    Some of these benefits include a comprehensive review of your medical history including lifestyle, illnesses that may run in your family, and various assessments and screenings as appropriate.    After reviewing your medical record and screening and assessments performed today your provider may have ordered immunizations, labs, imaging, and/or referrals for you.  A list of these orders (if applicable) as well as your Preventive Care list are included within your After Visit Summary for your review.    Other Preventive Recommendations:    ?? A preventive eye exam performed by an eye specialist is recommended every 1-2 years to screen for glaucoma; cataracts, macular degeneration, and other eye disorders.  ?? A preventive dental visit is recommended every 6 months.  ?? Try to get at least 150 minutes of exercise per week or 10,000 steps per day on a pedometer .  ?? Order or download the FREE "Exercise & Physical Activity: Your Everyday Guide" from [employer]. Call 314-346-4689 or search [employer] online.  ?? You need 1200-1500 mg of calcium and 1000-2000 IU of vitamin D per day. It is possible to meet your calcium requirement with diet alone, but a vitamin D supplement is usually necessary to meet this goal.  ?? When exposed to the sun, use a sunscreen that protects against both UVA and UVB radiation with an SPF of 30 or greater. Reapply every 2 to 3 hours or after sweating, drying off with a towel, or swimming.  ?? Always wear a seat belt when traveling in a car. Always wear a helmet when riding a bicycle or motorcycle.

## 2019-09-30 LAB — COMPREHENSIVE METABOLIC PANEL
ALT: 17 U/L
AST: 21 U/L
Albumin: 4.5
Alkaline Phosphatase: 86 U/L
Anion Gap: 9 mmol/L
BUN: 19 mg/dL
CO2: 27 mmol/L
Calcium: 9.1 mg/dL
Chloride: 103 mmol/L
Creatinine: 1.04
Gfr Calculated: >60
Glucose: 112 mg/dL — ABNORMAL HIGH
Potassium: 3.9 mmol/L
Sodium: 139 mmol/L
Total Bilirubin: 0.5 mg/dL (ref 0.1–1.4)
Total Protein: 7.7

## 2019-09-30 LAB — CBC WITH AUTO DIFFERENTIAL
Basophils %: 0.3 %
Basophils Absolute: 0.03 /??L
Eosinophils %: 2.1 %
Eosinophils Absolute: 0.2 /??L
Hematocrit: 45.6 % (ref 41–53)
Hemoglobin: 15.1 g/dL (ref 13.5–17.5)
Lymphocytes %: 25.8 %
Lymphocytes Absolute: 2.5 /??L
MCH: 31.1 pg
MCHC: 33.1 g/dL
MCV: 93.8 fL
MPV: 12.4 fL
Monocytes %: 7.1 %
Monocytes Absolute: 0.69 /??L
Neutrophils %: 64.3 %
Neutrophils Absolute: 6.22 /??L
Platelets: 177 K/??L
RBC: 4.86 10^6/??L
RDW: 11.8 %
WBC: 9.68 10^3/mL

## 2019-09-30 LAB — LIPID PANEL
Chol/HDL Ratio: 3.17
Cholesterol non HDL: 89
Cholesterol, Total: 130 mg/dL
HDL: 41 mg/dL (ref 35–70)
LDL Calculated: 74 mg/dL (ref 0–160)
Triglycerides: 77 mg/dL
VLDL: 15 mg/dL

## 2019-10-03 ENCOUNTER — Encounter

## 2019-10-03 NOTE — Telephone Encounter (Signed)
From: Jeffrey Cisneros  To: Loreli Slot, MD  Sent: 10/03/2019 8:01 AM EDT  Subject: Non-Urgent Medical Question    Hi Dr. Darin Engels,    I noticed my height is listed as 5'4". I am 5'8". This makes my BMI calculation wrong. Can this be corrected. Also, any feedback on my bloodwork?    Thank you,  Jeffrey Cisneros

## 2019-11-28 ENCOUNTER — Encounter: Admit: 2019-11-28 | Discharge: 2019-11-28 | Payer: MEDICARE | Primary: Family Medicine

## 2019-11-28 DIAGNOSIS — Z23 Encounter for immunization: Secondary | ICD-10-CM

## 2019-11-28 NOTE — Progress Notes (Signed)
The patient, Jeffrey Cisneros, identity was verified by name and DOB  written order VIS (s) given to patient for review prior to immunization administration.  Received informed consent to proceed with Fluzone High-dose (right deltoid) immunization (s). Immunizations given as ordered. Tolerated procedure well.Supervising MD Darin Engels.

## 2019-12-30 NOTE — Telephone Encounter (Signed)
From: [name]  Sent: 12/30/2019 4:07 PM EST  To: [facility] Clinical Staff  Subject: RE: Visit Follow-Up Question    I have a note in my calendar to get pre diabetes blood work. I would like to confirm whether that is accurate and if you ordered it.    Thank you!    ----- Message -----  From: Dr. Loreli Slot  Sent: 12/26/19, 8:42 AM  To: [name]  Subject: RE: Visit Follow-Up Question    Thank you so much!       ----- Message -----   From:Tyreon Nessel   Sent:12/25/2019 9:32 AM EDT   HE:RDEYC Manya Silvas, MD   Subject:RE: Visit Follow-Up Question    Wonderful! They are just finishing a brand new hospital from the ground up [place] [facility] [place]! Best wishes on your new adventure. It is a beautiful area!      ----- Message -----   From:Dr. Loreli Slot   Sent:12/25/2019 9:28 AM EDT   XK:GYJEHUD Azalee Course   Subject:RE: Visit Follow-Up Question    Hi Mr Motta,   That sounds great!  We are moving because of my husband's new job and his employer is in [place].   I haven't started looking for a position there yet but I may end up being close to you.  If I do know before I leave, I will let you know!  It's been my pleasure taking care of you and your wife and wish you all the best!        ----- Message -----   From:Chasin Chien   Sent:12/24/2019 12:04 PM EDT   JS:HFWYO Manya Silvas, MD   Subject:RE: Visit Follow-Up Question    Wow! Thank you. We are also building a house in [place]. What town and practice is she moving to?      ----- Message -----   From:Jasaiah Karwowski, MA   Sent:12/24/2019 11:59 AM EDT   VZ:CHYIFOY Nooney   Subject:RE: Visit Follow-Up Question    Hi Kathlene November,    Dr. Darin Engels is moving to [place]. You can either stay at this location if you want and see Dr. Rennie Plowman or Dr. Marge Duncans. There is also other facilities in [place] and [place]. I know Dr. Wynelle Link in [place] is taking new patients and Dr. Vickii Chafe in [place] ( I hear she practices like Dr. Darin Engels).  If you need anything else let me know.    Thanks,  Asher Muir       ----- Message -----   From:Vamsi Whetzel   Sent:12/24/2019 6:14 AM EDT   DX:AJOIN Manya Silvas, MD   Subject:Visit Follow-Up Question    Hi Dr. Manya Silvas,    I received a notice that you will no longer be with [facility]. We would like to stay with you. Where are you moving your practice?    Thank you,  Raylene Miyamoto

## 2020-01-01 ENCOUNTER — Encounter

## 2020-01-01 LAB — HEMOGLOBIN A1C
Hemoglobin A1C: 5.8 % — AB
eAG: 120 mg/dL

## 2020-01-28 NOTE — Telephone Encounter (Signed)
Name of Caller: Jeffrey Cisneros    Relation to patient: patient    Contact Phone Number: [phone]    Appointment Scheduled with: 03/30/2020    Appointment Date & Time: [place]    Reason for visit (are you having any symptoms) : [facility]    Transportation Issues/ concerns: N/A    Special Accommodations? ( wheel chair, etc) : N/A     Current medications: See medication list    Any refills need?: N/A    Any chronic conditions you would like the physician to know of?: N/A

## 2020-03-30 ENCOUNTER — Ambulatory Visit: Admit: 2020-03-30 | Discharge: 2020-03-30 | Payer: MEDICARE | Attending: Family Medicine | Primary: Family Medicine

## 2020-03-30 DIAGNOSIS — Z7689 Persons encountering health services in other specified circumstances: Secondary | ICD-10-CM

## 2020-03-30 NOTE — Progress Notes (Signed)
03/30/2020    Jeffrey Cisneros is a 65 y.o. male who presents for establishing with the practice     Subjective      Prior provider: Dr. Darin Engels  Specialists seen: uro    History of prostate CA, HTN, OSA, obesity class 1, HLD, seasonal allergies (+spring, fall, dust, mold)    Chronic medications as listed below. he is compliant with medications and denies any side effects.     Work - retired. Worked for [employer]  Exercise - biking  Diet - limiting white bread & rice  Living situation - wife    Acute concerns of     Chief Complaint   Patient presents with   ??? New Patient     Pt states he is here to establish with the new doctor. Pt states he does not remember when he had his last colonoscopy done.        HTN  - med: amlodipine, losartan, chlorthalidone  - follows with nephro  - no issues    Obesity class 1  - actively trying to lose weight  - started at 212 lbs  - exercise: recumbent bike, shoveling snow  - diet: avoiding seconds and fried chicken    OSA  - not currently using CPAP   - reports sleeping better without it    HLD  - med: atorvastatin  - no issues    Hx of Prostate CA  - dx 10 yrs ago  - follows with uro (at [facility])  - strong family hx 2 brothers with prostate Ca    HM  - last colonoscopy in 2014.    FHx  - brother - renal CA, prostate CA w/ mets  - mom - breast CA, thryoid issues, HTN  - dad - HTN  - brother - thyroid issues      Review of Systems   All other systems reviewed and are negative.         Jeffrey Cisneros is a 65 y.o. male with the following history as recordedin EpicCare:  Patient Active Problem List    Diagnosis Date Noted   ??? Personal history of prostate cancer 09/29/2019   ??? Mixed hyperlipidemia 09/29/2019   ??? OSA (obstructive sleep apnea) 09/29/2019   ??? Essential hypertension 10/17/2017     Current Outpatient Medications   Medication Sig Dispense Refill   ??? chlorthalidone (HYGROTON) 25 MG tablet Take 25 mg by mouth three times a week     ??? atorvastatin (LIPITOR) 10 MG tablet TAKE 1  TABLET BY MOUTH EVERY DAY 90 tablet 3   ??? amLODIPine (NORVASC) 10 MG tablet TAKE 1 TABLET BY MOUTH EVERY DAY 90 tablet 1   ??? losartan (COZAAR) 50 MG tablet Take 1 tablet by mouth daily 90 tablet 3   ??? Probiotic Product (PROBIOTIC + TURMERIC EXTRACT PO) Take 1,000 mg by mouth daily     ??? Multiple Vitamin (MULTI-VITAMIN DAILY PO) Take by mouth     ??? cetirizine (ZYRTEC) 10 MG tablet Take 10 mg by mouth daily     ??? Cholecalciferol (VITAMIN D3) 2000 units CAPS Take by mouth daily (Patient not taking: Reported on 03/30/2020)       No current facility-administered medications for this visit.       Allergies: Dilaudid [hydromorphone hcl] and Seasonal  Past Medical History:   Diagnosis Date   ??? Hypertension    ??? Osteopenia    ??? Prostate cancer [facility])      Past Surgical History:  Procedure Laterality Date   ??? PROSTATE SURGERY     ??? SKIN BIOPSY       Family History   Problem Relation Age of Onset   ??? Thyroid Disease Mother    ??? Hypertension Mother    ??? Breast Cancer Mother    ??? Other Mother         Dementia   ??? Hypertension Father    ??? Other Father         Pace Maker   ??? Heart Disease Father    ??? Thyroid Disease Brother    ??? Cancer Brother    ??? Cancer Brother     No other pertinent family history.   Social History     Tobacco Use   ??? Smoking status: Never Smoker   ??? Smokeless tobacco: Never Used   Substance Use Topics   ??? Alcohol use: Yes     Comment: social, 1 glass of beer or wine per week       Objective:     Vitals:    03/30/20 1005   BP: 134/74   Site: Left Upper Arm   Position: Sitting   Cuff Size: Small Adult   Pulse: 90   Temp: 97.7 ??F (36.5 ??C)   TempSrc: Temporal   SpO2: 98%   Weight: 198 lb (89.8 kg)   Height: 5\' 8"  (1.727 m)    Body mass index is 30.11 kg/m??.  No LMP for male patient.    Physical Exam  Vitals reviewed.   Constitutional:       General: He is not in acute distress.     Appearance: Normal appearance. He is obese. He is not toxic-appearing.   HENT:      Head: Normocephalic and atraumatic.      Right Ear:  Tympanic membrane, ear canal and external ear normal.      Left Ear: Tympanic membrane, ear canal and external ear normal.      Nose: Nose normal.      Mouth/Throat:      Mouth: Mucous membranes are moist.      Pharynx: Oropharynx is clear.   Eyes:      Conjunctiva/sclera: Conjunctivae normal.      Pupils: Pupils are equal, round, and reactive to light.   Neck:      Thyroid: No thyroid mass or thyroid tenderness.   Cardiovascular:      Rate and Rhythm: Normal rate and regular rhythm.      Pulses: Normal pulses.      Heart sounds: Normal heart sounds. No murmur heard.      Pulmonary:      Effort: Pulmonary effort is normal. No respiratory distress.      Breath sounds: Normal breath sounds. No wheezing.   Abdominal:      General: Abdomen is flat. Bowel sounds are normal. There is no distension.      Palpations: Abdomen is soft. There is no mass.      Tenderness: There is no abdominal tenderness.   Musculoskeletal:         General: Normal range of motion.      Cervical back: Normal range of motion. No rigidity.      Right lower leg: No edema.      Left lower leg: No edema.   Lymphadenopathy:      Cervical: No cervical adenopathy.   Skin:     General: Skin is warm and dry.      Capillary Refill:  Capillary refill takes less than 2 seconds.   Neurological:      General: No focal deficit present.      Mental Status: He is alert and oriented to person, place, and time. Mental status is at baseline.   Psychiatric:         Mood and Affect: Mood normal.         Behavior: Behavior normal.         Thought Content: Thought content normal.         Judgment: Judgment normal.           Assessment & Plan     1. Establishing care with new doctor, encounter for  Reviewed hx, labs and imaging    2. Essential hypertension  Chronic and stable. Follows with nephro    3. OSA (obstructive sleep apnea)  In remission 2/2 weight loss    4. Personal history of prostate cancer  In remission. Follows with uro    5. Mixed hyperlipidemia  Chronic  and stable. Cont statin      Return in about 6 months (around 09/27/2020) for AWV.    No orders of the defined types were placed in this encounter.    No orders of the defined types were placed in this encounter.      R/B/A of medications were discussed.  Encouraged healthy eating habits, regular exercise, and continue compliance with routine follow ups and medications.   Diagnosis and treatment plan were reviewed with patient and they agree with the current treatment plan.    The patient's questions were answered, and they verbalized understanding of above instructions.   Time spent with patient was more than 50% visit in direct face to face counseling as documented in note.    An electronic signature was used to authenticate this note.    --Heather Roberts, MD on 03/30/2020 at 10:45 AM

## 2020-03-30 NOTE — Progress Notes (Signed)
The patient, Jeffrey Cisneros, identity was verified by name and dob. Supervising provider for clinic visit: Dr.[place].

## 2020-03-30 NOTE — Patient Instructions (Signed)
The medication list included in this document is our record of what you are currently taking, including any changes that were made at today's visit.?? If you find any differences when compared to your medications at home, or have any questions that were not answered at your visit, please contact the office.

## 2020-08-20 ENCOUNTER — Encounter

## 2020-08-20 MED ORDER — AMLODIPINE BESYLATE 10 MG PO TABS
10 MG | ORAL_TABLET | Freq: Every day | ORAL | 3 refills | Status: AC
Start: 2020-08-20 — End: ?

## 2020-08-20 MED ORDER — ATORVASTATIN CALCIUM 10 MG PO TABS
10 MG | ORAL_TABLET | Freq: Every day | ORAL | 3 refills | Status: AC
Start: 2020-08-20 — End: ?

## 2020-08-20 NOTE — Telephone Encounter (Signed)
Medication Name: atorvastatin (LIPITOR) 10 MG tablet     Medication Dosage: 10 mg (Milligrams)    Monthly Quantity Needed: 90    How many day supply requesting: 90 days    Medication Route: Oral (PO)    Medication Administration Time: TAKE 1 TABLET BY MOUTH EVERY DAY    Date of Last Refill: (See Medication Tab): 09/17/19    If taking medication PRN - Reason for taking medication: n/a    If this is a controlled substance do you receive this or any other controlled medication from any other doctor or facility? No    Ordering Physician: Dr. Rennie Plowman    Date of Last Office Visit: Visit date not found      Date of Next Office Visit: Visit date not found      Updated/Validated Preferred Pharmacy: Yes     [id number] - [place], [place] - [address] [phone] - [phone]    Patient instructed to contact the pharmacy prior to picking up the medication: Yes       2. Medication Name: amLODIPine (NORVASC) 10 MG tablet     Medication Dosage: 10 mg (Milligrams)    Monthly Quantity Needed: 90    How many day supply requesting: 90 days    Medication Route: Oral (PO)    Medication Administration Time: TAKE 1 TABLET BY MOUTH EVERY DAY    Date of Last Refill: (See Medication Tab): 05/12/19    If taking medication PRN - Reason for taking medication: n/a    If this is a controlled substance do you receive this or any other controlled medication from any other doctor or facility? No

## 2020-08-20 NOTE — Telephone Encounter (Signed)
No longer patient here, has established with Dr. Vickii Chafe, please send there

## 2020-08-20 NOTE — Telephone Encounter (Signed)
meds sent to pharm

## 2020-09-27 ENCOUNTER — Encounter: Attending: Family Medicine | Primary: Family Medicine

## 2023-05-23 IMAGING — MR MRI BRAIN WITHOUT CONTRAST
8 of 11 series · 35 of 48 positions shown · IV contrast (gadolinium)
Comparison: None.

________________________________________________________________________________________________ 
MRI BRAIN WITHOUT CONTRAST, 05/23/2023 [DATE]: 
CLINICAL INDICATION: Left eye visual issues.
TECHNIQUE: Multiplanar, multiecho position MR images of the brain were performed 
without intravenous gadolinium enhancement. Patient was scanned on a 3T magnet.

[Series 101: survey · axial · 10.0mm · 0.98mm/px · z∈[+0,+125]mm · 2 of 5 slices shown]
[im 1/5]
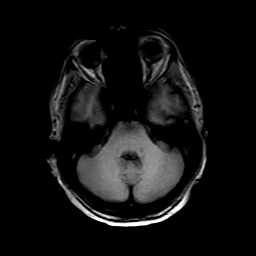
[im 5/5]
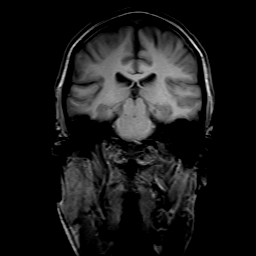

[Series 203: dadc map · axial · 4.0mm · 1.07mm/px · z∈[-61,+90]mm · 3 of 32 slices shown (1 of 2)]
[im 1/32]
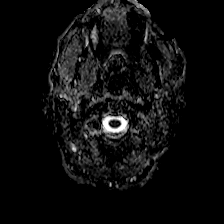
[im 16/32]
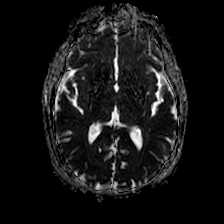
[im 32/32]
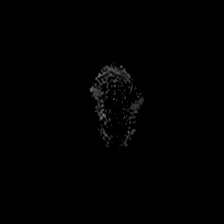

[Series 204: isob (id) · axial · 4.0mm · 1.07mm/px · z∈[-61,+90]mm · 3 of 32 slices shown (1 of 2)]
[im 1/32]
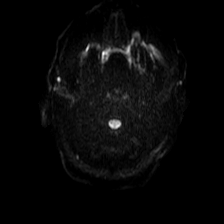
[im 16/32]
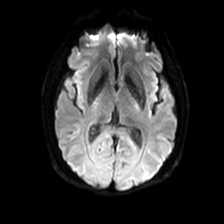
[im 32/32]
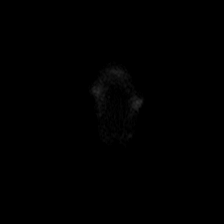

[Series 303: dadc map · coronal · 4.0mm · 0.81mm/px · 4 of 38 slices shown (2 of 2)]
[im 1/38]
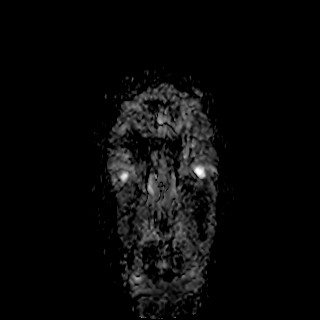
[im 13/38]
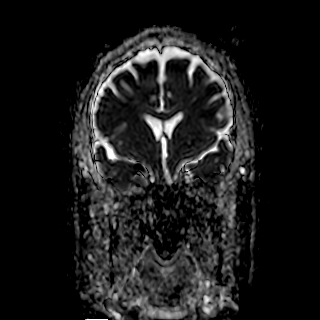
[im 25/38]
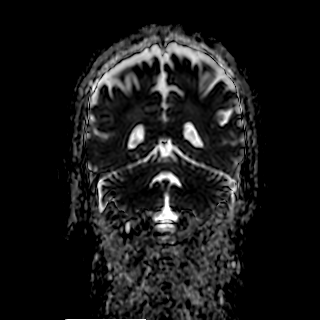
[im 38/38]
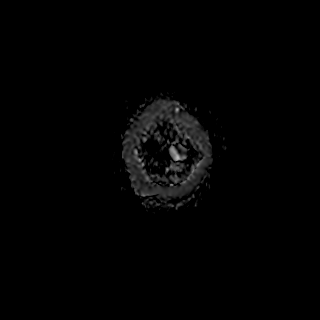

[Series 304: isob (id) · coronal · 4.0mm · 0.81mm/px · 1 of 38 slices shown (2 of 2)]
[im 1/38]
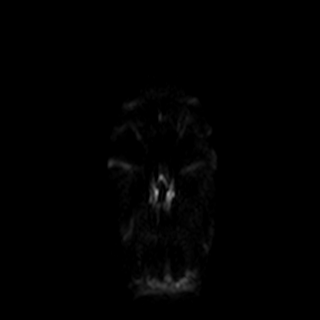

[Series 501: FLAIR fat-sat · axial · 5.0mm · 0.60mm/px · z∈[-63,+89]mm · 3 of 27 slices shown]
[im 1/27]
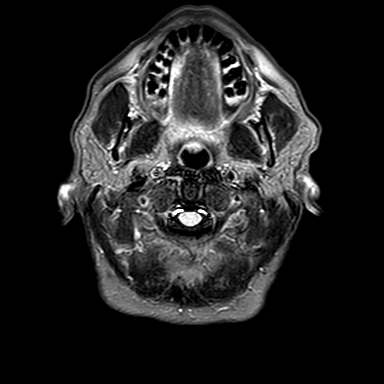
[im 14/27]
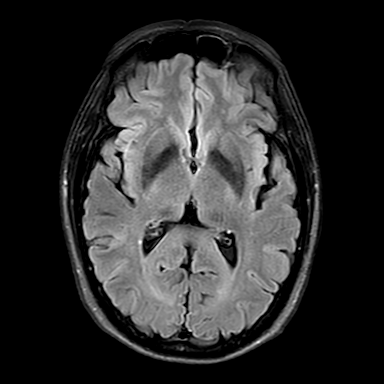
[im 27/27]
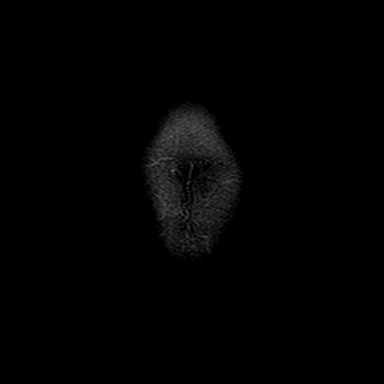

[Series 601: SWI · axial · 3.0mm · 0.53mm/px · z∈[-60,+85]mm · 11 of 100 slices shown (1 of 2)]
[im 1/100]
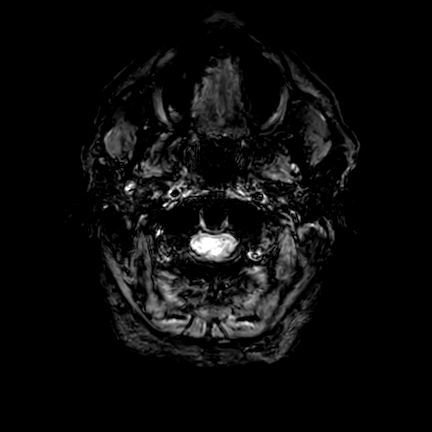
[im 10/100]
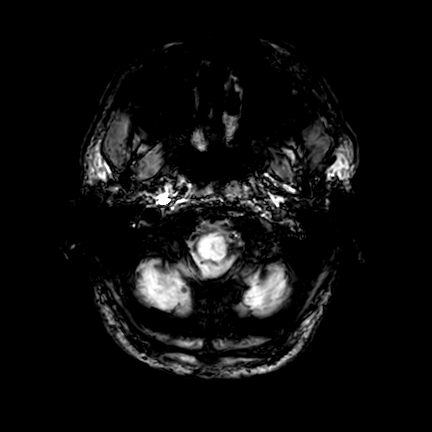
[im 20/100]
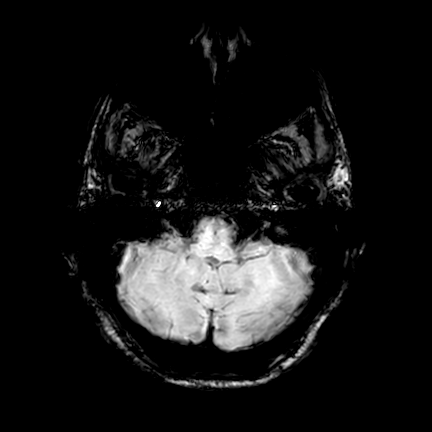
[im 30/100]
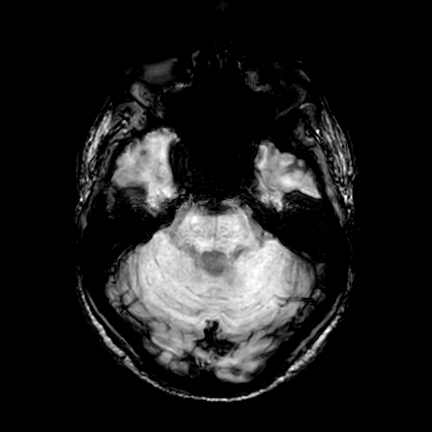
[im 40/100]
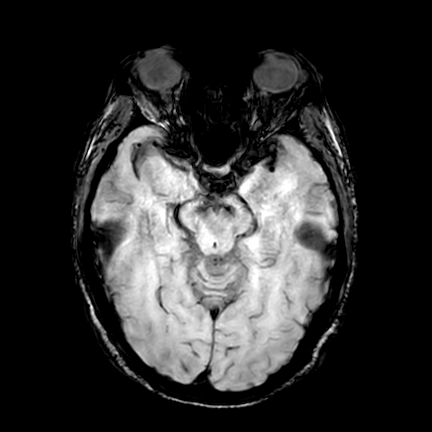
[im 50/100]
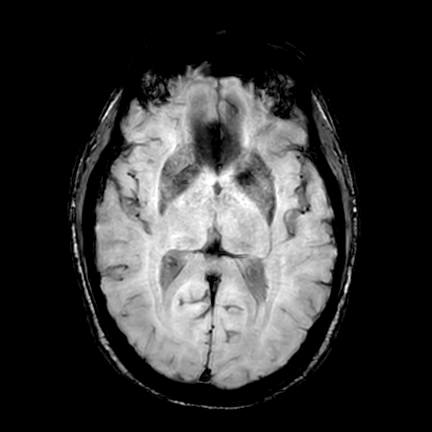
[im 60/100]
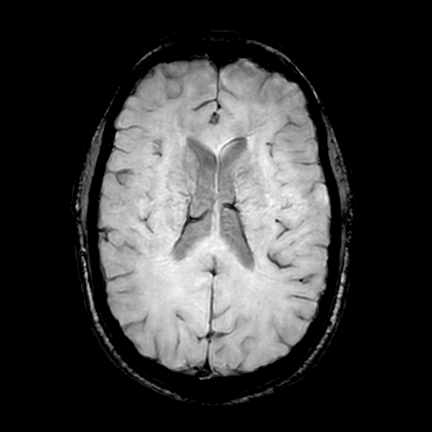
[im 70/100]
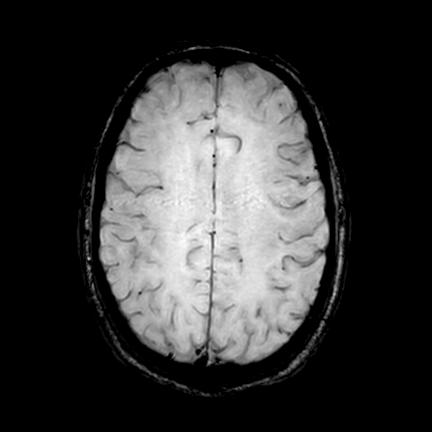
[im 80/100]
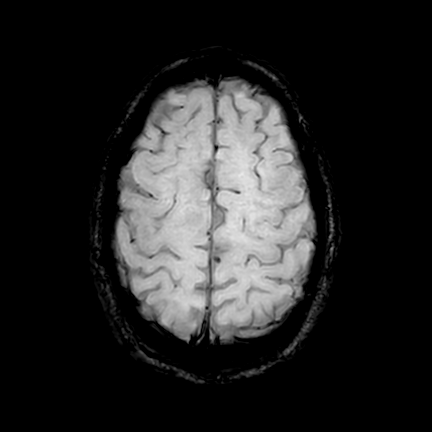
[im 90/100]
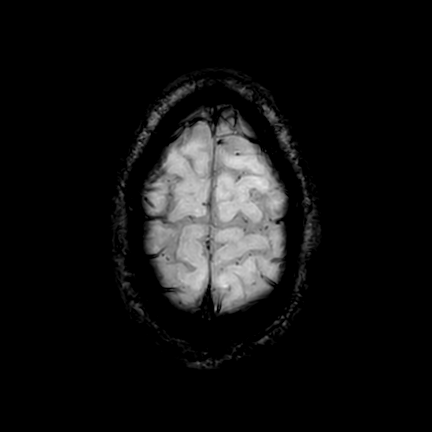
[im 100/100]
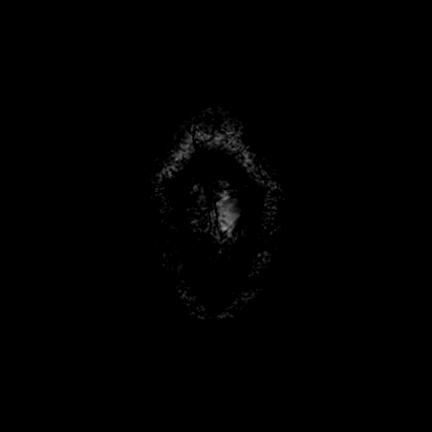

[Series 602: SWI · axial · 10.0mm · 0.53mm/px · z∈[-60,+90]mm · 8 of 78 slices shown (2 of 2)]
[im 1/78]
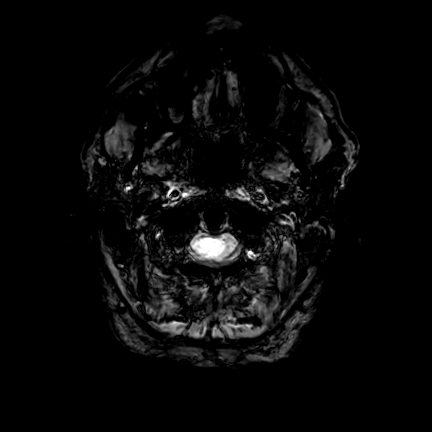
[im 12/78]
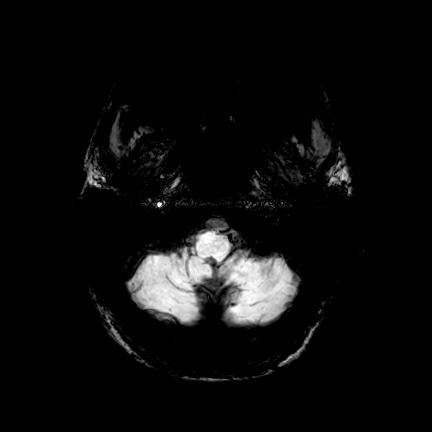
[im 23/78]
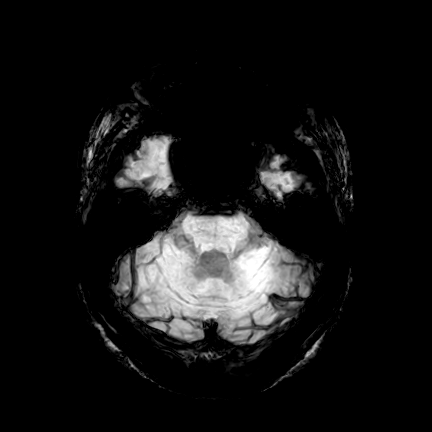
[im 34/78]
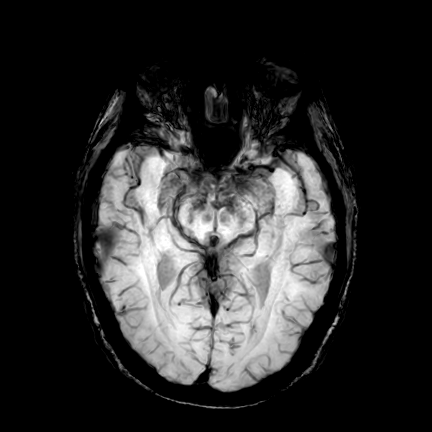
[im 45/78]
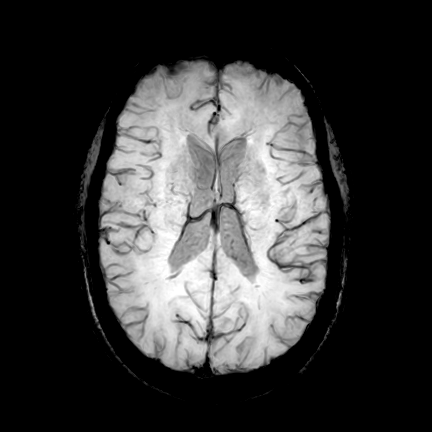
[im 56/78]
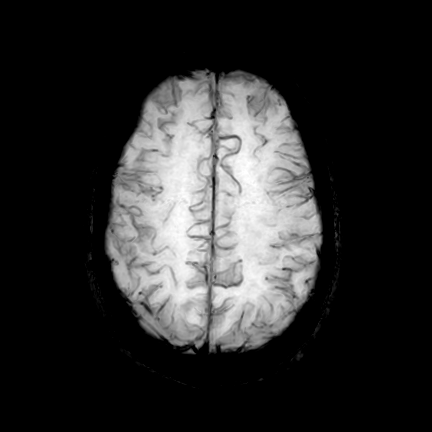
[im 67/78]
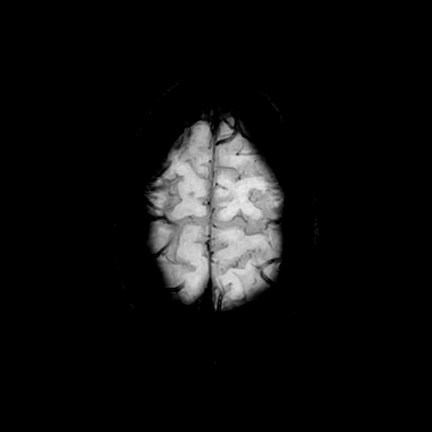
[im 78/78]
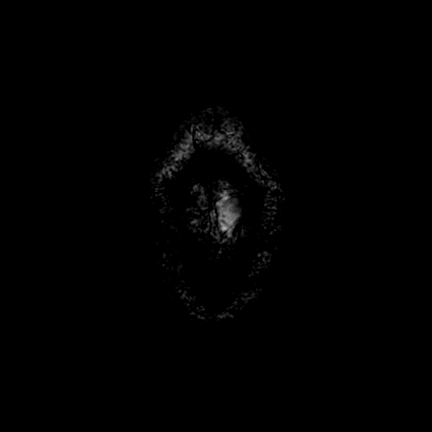

[35 of 48 positions shown; findings below may reference images not displayed]

FINDINGS: -------------------------------------------------------------------------------- 
------------------------- 
INTRACRANIAL: 
Periventricular and deep white matter change, probably secondary to 
microangiopathy. This is overall mild. No acute ischemia. No abnormal foci of 
susceptibility artifact in the brain. Patency of intracranial vascular flow 
voids. No acute intracranial hemorrhage, mass effect, midline shift. 
-------------------------------------------------------------------------------- 
----------------------- 
OTHER: 
ORBITS/SINUSES/T-BONES:  Status post bilateral cataract extraction.  Mastoid air 
cells and middle ear cavities are grossly clear.  Mild mucosal changes in the 
paranasal sinuses. 
BONE/SOFT TISSUES: No mass or acute abnormality.  
-------------------------------------------------------------------------------- 
-------------------
IMPRESSION: No acute intracranial abnormality. Mild white matter microangiopathic changes.
